# Patient Record
Sex: Female | Born: 1935 | Race: White | Hispanic: No | Marital: Married | Smoking: Never smoker
Health system: Southern US, Community
[De-identification: ages and names within clinical notes are randomized; demographics above are authoritative.]

## PROBLEM LIST (undated history)

## (undated) DIAGNOSIS — I2510 Atherosclerotic heart disease of native coronary artery without angina pectoris: Secondary | ICD-10-CM

## (undated) DIAGNOSIS — I252 Old myocardial infarction: Secondary | ICD-10-CM

## (undated) DIAGNOSIS — R531 Weakness: Secondary | ICD-10-CM

## (undated) DIAGNOSIS — R5383 Other fatigue: Secondary | ICD-10-CM

## (undated) DIAGNOSIS — E7800 Pure hypercholesterolemia, unspecified: Secondary | ICD-10-CM

## (undated) DIAGNOSIS — I10 Essential (primary) hypertension: Secondary | ICD-10-CM

## (undated) DIAGNOSIS — D649 Anemia, unspecified: Secondary | ICD-10-CM

## (undated) DIAGNOSIS — Z8619 Personal history of other infectious and parasitic diseases: Secondary | ICD-10-CM

## (undated) DIAGNOSIS — E559 Vitamin D deficiency, unspecified: Secondary | ICD-10-CM

## (undated) HISTORY — DX: Pure hypercholesterolemia, unspecified: E78.00

## (undated) HISTORY — DX: Personal history of other infectious and parasitic diseases: Z86.19

## (undated) HISTORY — DX: Old myocardial infarction: I25.2

## (undated) HISTORY — PX: BREAST EXCISIONAL BIOPSY: SUR124

## (undated) HISTORY — DX: Other fatigue: R53.83

## (undated) HISTORY — PX: CHOLECYSTECTOMY: SHX55

## (undated) HISTORY — DX: Atherosclerotic heart disease of native coronary artery without angina pectoris: I25.10

## (undated) HISTORY — PX: ABDOMINAL HYSTERECTOMY: SHX81

## (undated) HISTORY — PX: BREAST BIOPSY: SHX20

## (undated) HISTORY — DX: Essential (primary) hypertension: I10

## (undated) HISTORY — DX: Weakness: R53.1

---

## 1997-08-03 ENCOUNTER — Other Ambulatory Visit: Admission: RE | Admit: 1997-08-03 | Discharge: 1997-08-03 | Payer: Self-pay | Admitting: Unknown Physician Specialty

## 1998-02-10 ENCOUNTER — Ambulatory Visit (HOSPITAL_COMMUNITY): Admission: RE | Admit: 1998-02-10 | Discharge: 1998-02-10 | Payer: Self-pay | Admitting: Internal Medicine

## 1998-02-10 ENCOUNTER — Encounter: Payer: Self-pay | Admitting: Internal Medicine

## 1998-08-05 ENCOUNTER — Other Ambulatory Visit: Admission: RE | Admit: 1998-08-05 | Discharge: 1998-08-05 | Payer: Self-pay | Admitting: Obstetrics & Gynecology

## 1999-02-14 ENCOUNTER — Encounter: Payer: Self-pay | Admitting: Internal Medicine

## 1999-02-14 ENCOUNTER — Ambulatory Visit (HOSPITAL_COMMUNITY): Admission: RE | Admit: 1999-02-14 | Discharge: 1999-02-14 | Payer: Self-pay | Admitting: Internal Medicine

## 1999-04-25 ENCOUNTER — Encounter: Payer: Self-pay | Admitting: Internal Medicine

## 1999-04-25 ENCOUNTER — Inpatient Hospital Stay (HOSPITAL_COMMUNITY): Admission: EM | Admit: 1999-04-25 | Discharge: 1999-04-29 | Payer: Self-pay | Admitting: Emergency Medicine

## 1999-04-25 DIAGNOSIS — I252 Old myocardial infarction: Secondary | ICD-10-CM

## 1999-04-25 HISTORY — DX: Old myocardial infarction: I25.2

## 1999-05-24 ENCOUNTER — Encounter (HOSPITAL_COMMUNITY): Admission: RE | Admit: 1999-05-24 | Discharge: 1999-07-03 | Payer: Self-pay | Admitting: Cardiology

## 1999-07-04 ENCOUNTER — Encounter (HOSPITAL_COMMUNITY): Admission: RE | Admit: 1999-07-04 | Discharge: 1999-08-05 | Payer: Self-pay | Admitting: Cardiology

## 1999-08-24 ENCOUNTER — Other Ambulatory Visit: Admission: RE | Admit: 1999-08-24 | Discharge: 1999-08-24 | Payer: Self-pay | Admitting: Obstetrics and Gynecology

## 1999-11-09 ENCOUNTER — Ambulatory Visit (HOSPITAL_COMMUNITY): Admission: RE | Admit: 1999-11-09 | Discharge: 1999-11-10 | Payer: Self-pay | Admitting: Cardiology

## 2000-02-15 ENCOUNTER — Encounter: Payer: Self-pay | Admitting: Internal Medicine

## 2000-02-15 ENCOUNTER — Ambulatory Visit (HOSPITAL_COMMUNITY): Admission: RE | Admit: 2000-02-15 | Discharge: 2000-02-15 | Payer: Self-pay | Admitting: Internal Medicine

## 2000-10-02 ENCOUNTER — Other Ambulatory Visit: Admission: RE | Admit: 2000-10-02 | Discharge: 2000-10-02 | Payer: Self-pay | Admitting: Obstetrics and Gynecology

## 2001-02-18 ENCOUNTER — Encounter: Payer: Self-pay | Admitting: Internal Medicine

## 2001-02-18 ENCOUNTER — Ambulatory Visit (HOSPITAL_COMMUNITY): Admission: RE | Admit: 2001-02-18 | Discharge: 2001-02-18 | Payer: Self-pay | Admitting: Internal Medicine

## 2001-11-07 ENCOUNTER — Other Ambulatory Visit: Admission: RE | Admit: 2001-11-07 | Discharge: 2001-11-07 | Payer: Self-pay | Admitting: Obstetrics and Gynecology

## 2002-02-19 ENCOUNTER — Encounter: Payer: Self-pay | Admitting: Obstetrics and Gynecology

## 2002-02-19 ENCOUNTER — Ambulatory Visit (HOSPITAL_COMMUNITY): Admission: RE | Admit: 2002-02-19 | Discharge: 2002-02-19 | Payer: Self-pay | Admitting: Obstetrics and Gynecology

## 2003-01-05 ENCOUNTER — Other Ambulatory Visit: Admission: RE | Admit: 2003-01-05 | Discharge: 2003-01-05 | Payer: Self-pay | Admitting: Dermatology

## 2003-02-25 ENCOUNTER — Ambulatory Visit (HOSPITAL_COMMUNITY): Admission: RE | Admit: 2003-02-25 | Discharge: 2003-02-25 | Payer: Self-pay | Admitting: Internal Medicine

## 2003-04-01 ENCOUNTER — Other Ambulatory Visit: Admission: RE | Admit: 2003-04-01 | Discharge: 2003-04-01 | Payer: Self-pay | Admitting: Dermatology

## 2004-03-30 ENCOUNTER — Ambulatory Visit (HOSPITAL_COMMUNITY): Admission: RE | Admit: 2004-03-30 | Discharge: 2004-03-30 | Payer: Self-pay | Admitting: Internal Medicine

## 2005-03-31 ENCOUNTER — Ambulatory Visit (HOSPITAL_COMMUNITY): Admission: RE | Admit: 2005-03-31 | Discharge: 2005-03-31 | Payer: Self-pay | Admitting: Internal Medicine

## 2005-04-19 ENCOUNTER — Encounter: Admission: RE | Admit: 2005-04-19 | Discharge: 2005-04-19 | Payer: Self-pay | Admitting: Internal Medicine

## 2005-12-06 ENCOUNTER — Inpatient Hospital Stay (HOSPITAL_BASED_OUTPATIENT_CLINIC_OR_DEPARTMENT_OTHER): Admission: RE | Admit: 2005-12-06 | Discharge: 2005-12-06 | Payer: Self-pay | Admitting: Cardiology

## 2006-04-25 ENCOUNTER — Ambulatory Visit (HOSPITAL_COMMUNITY): Admission: RE | Admit: 2006-04-25 | Discharge: 2006-04-25 | Payer: Self-pay | Admitting: Internal Medicine

## 2007-05-09 ENCOUNTER — Ambulatory Visit (HOSPITAL_COMMUNITY): Admission: RE | Admit: 2007-05-09 | Discharge: 2007-05-09 | Payer: Self-pay | Admitting: Internal Medicine

## 2008-03-05 ENCOUNTER — Emergency Department (HOSPITAL_BASED_OUTPATIENT_CLINIC_OR_DEPARTMENT_OTHER): Admission: EM | Admit: 2008-03-05 | Discharge: 2008-03-05 | Payer: Self-pay | Admitting: Emergency Medicine

## 2008-03-05 ENCOUNTER — Ambulatory Visit: Payer: Self-pay | Admitting: Diagnostic Radiology

## 2008-03-13 ENCOUNTER — Ambulatory Visit: Payer: Self-pay | Admitting: Interventional Radiology

## 2008-03-13 ENCOUNTER — Emergency Department (HOSPITAL_BASED_OUTPATIENT_CLINIC_OR_DEPARTMENT_OTHER): Admission: EM | Admit: 2008-03-13 | Discharge: 2008-03-13 | Payer: Self-pay | Admitting: Emergency Medicine

## 2008-05-21 ENCOUNTER — Ambulatory Visit (HOSPITAL_COMMUNITY): Admission: RE | Admit: 2008-05-21 | Discharge: 2008-05-21 | Payer: Self-pay | Admitting: Internal Medicine

## 2009-10-03 IMAGING — CT CT CERVICAL SPINE W/O CM
3 series · 16 of 33 positions shown, 19 images · non-contrast
Comparison: 03/13/2008

CLINICAL DATA: 75-year-old female fall, recent elbow fracture, neck
pain

CT CERVICAL SPINE WITHOUT CONTRAST
TECHNIQUE: Multidetector CT imaging of the cervical spine was
performed. Multiplanar CT image reconstructions were also
generated.

[Series 3: c_spine 2.0 b41s st · axial · 0.26mm/px · z∈[-278,-160]mm · 8 of 71 slices shown, 10 images]
[im 6/71  soft-tissue]
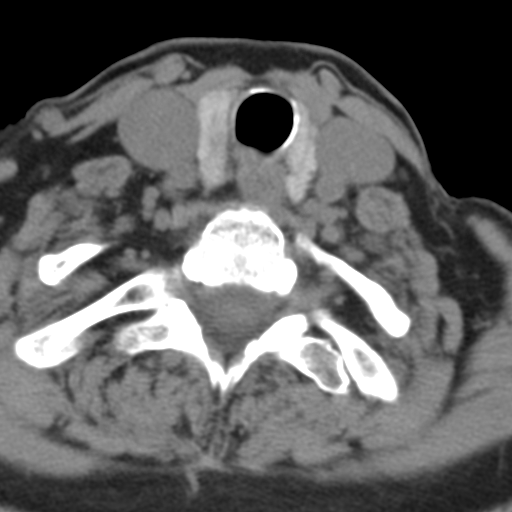
[im 6/71  bone]
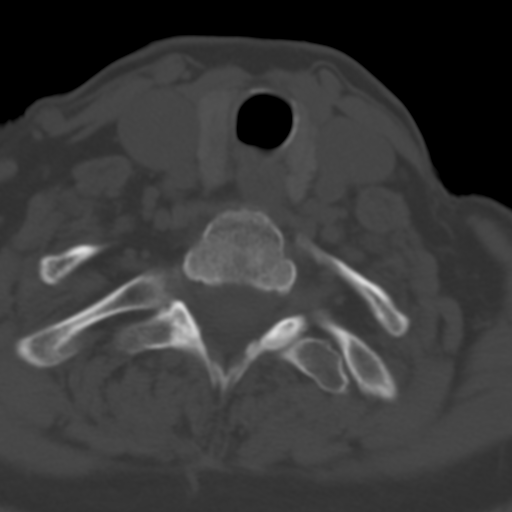
[im 17/71  bone]
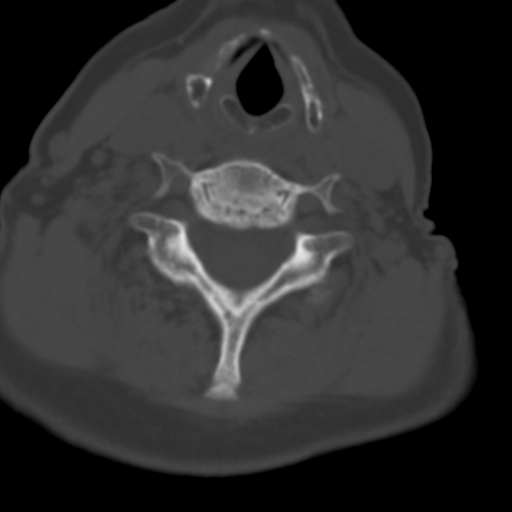
[im 22/71  bone]
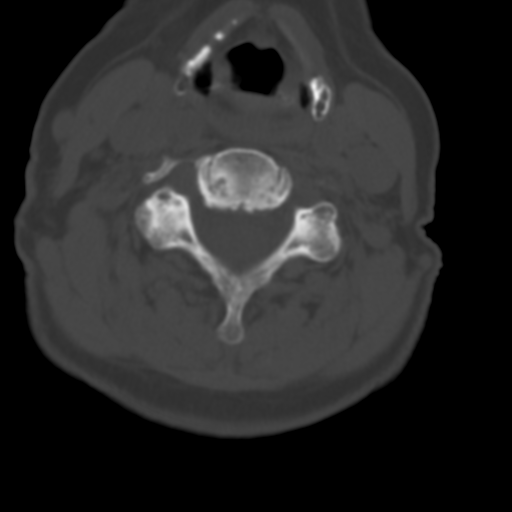
[im 33/71  bone]
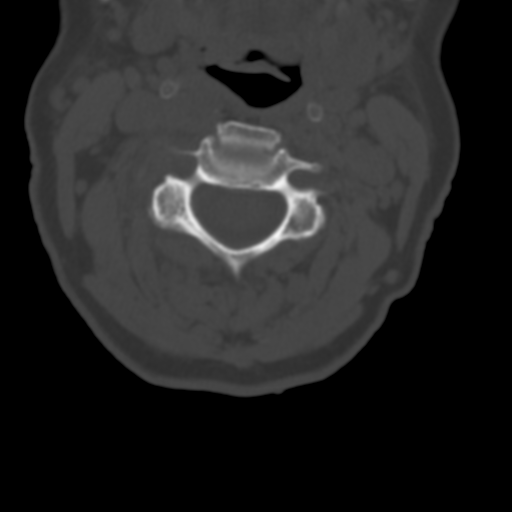
[im 38/71  soft-tissue]
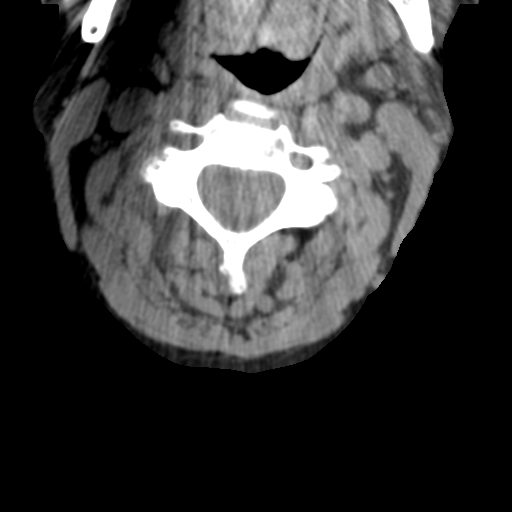
[im 38/71  bone]
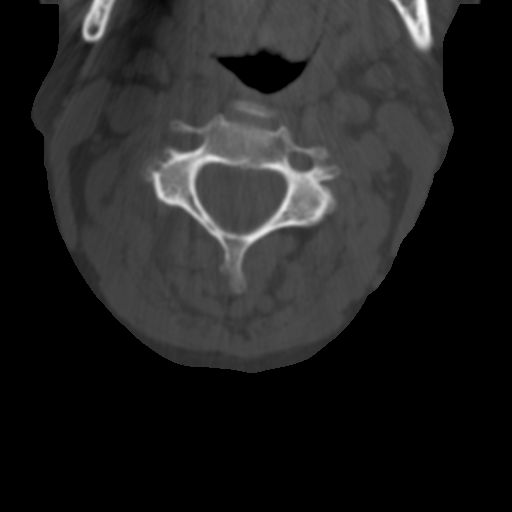
[im 49/71  bone]
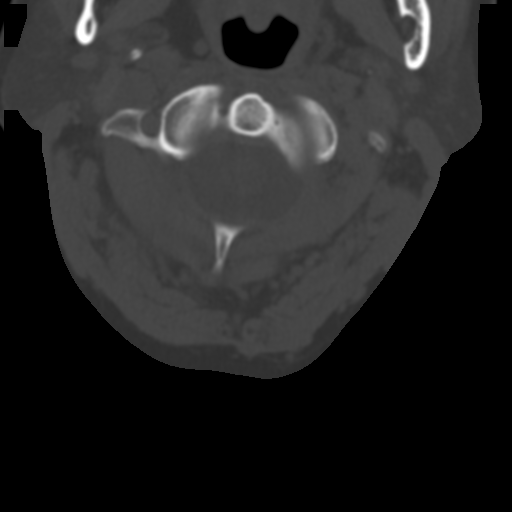
[im 54/71  bone]
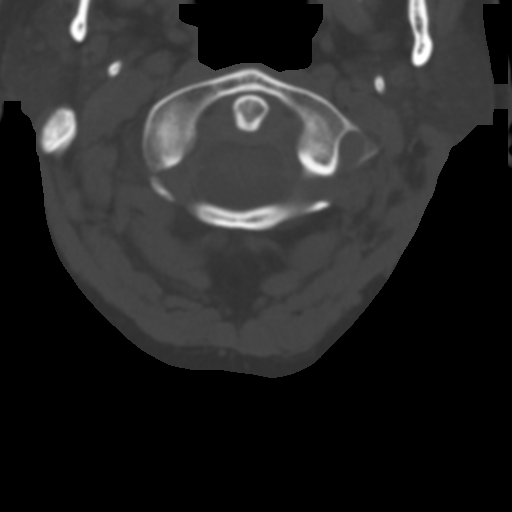
[im 65/71  bone]
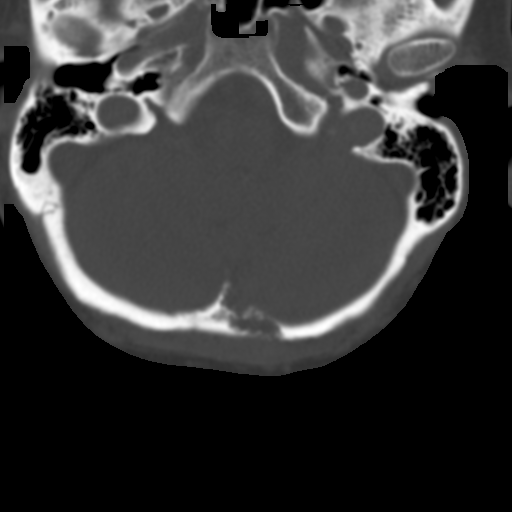

[Series 6: c_spine 2.0 coronal · coronal · 0.27mm/px · 3 of 52 slices shown]
[im 11/52  bone]
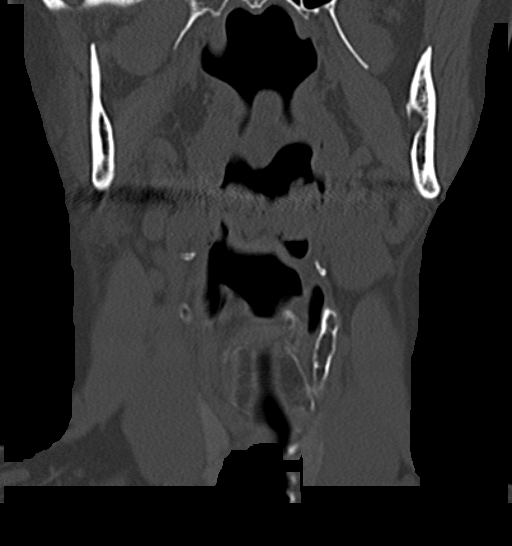
[im 21/52  bone]
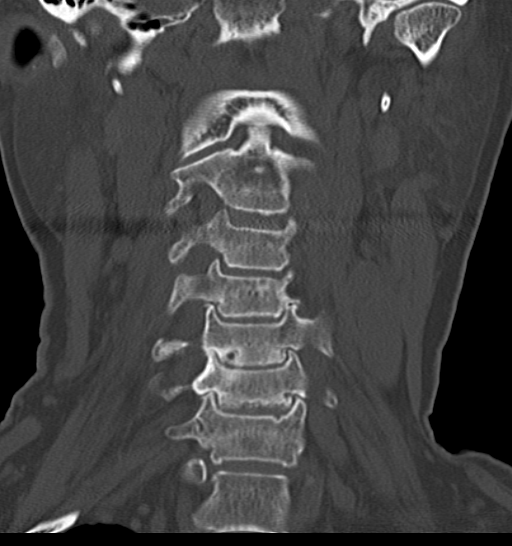
[im 31/52  bone]
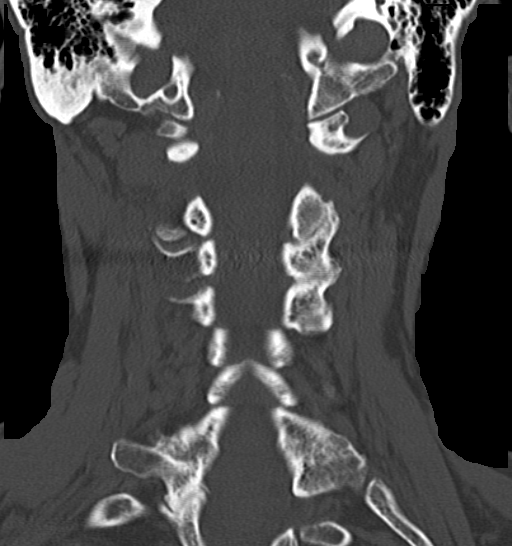

[Series 7: c_spine 2.0 sagittal · sagittal · 0.26mm/px · 5 of 56 slices shown, 6 images]
[im 19/56  bone]
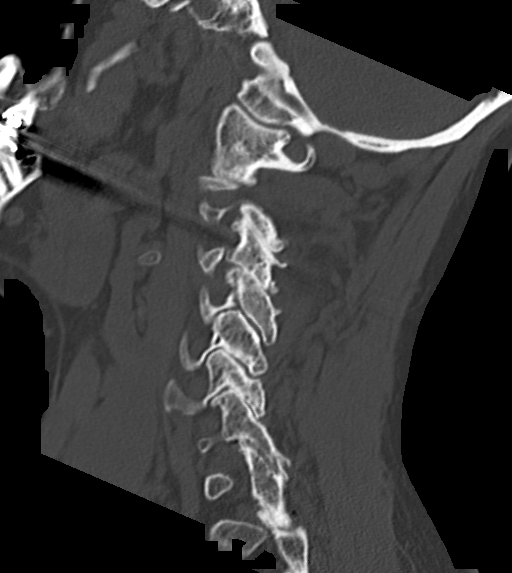
[im 23/56  bone]
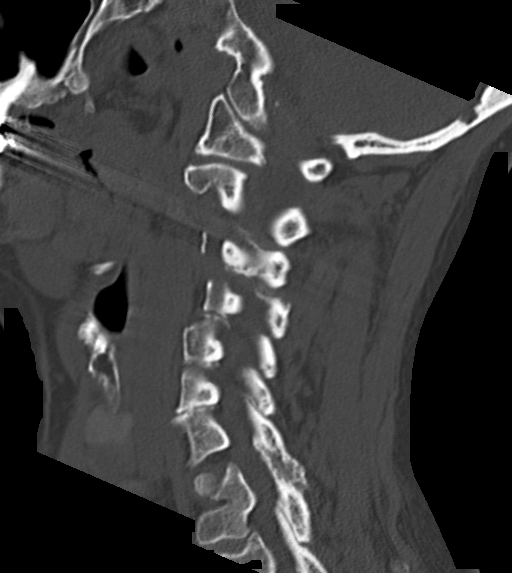
[im 28/56  soft-tissue]
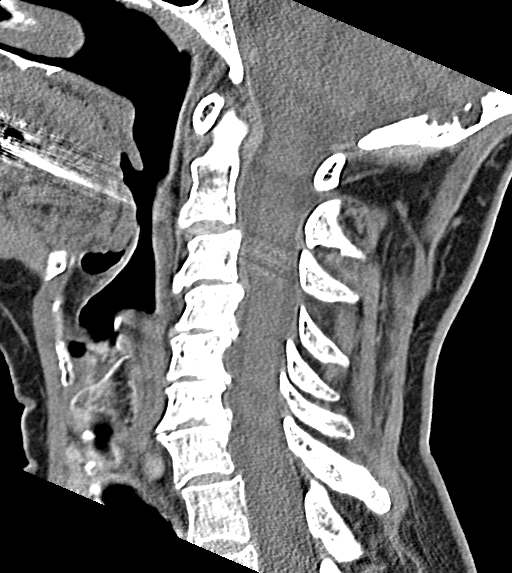
[im 28/56  bone]
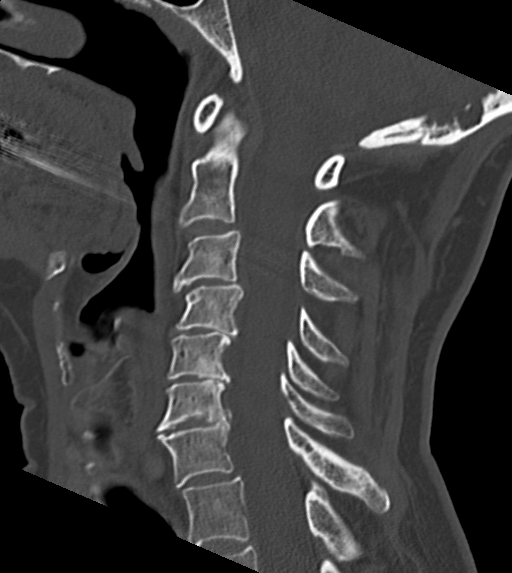
[im 33/56  bone]
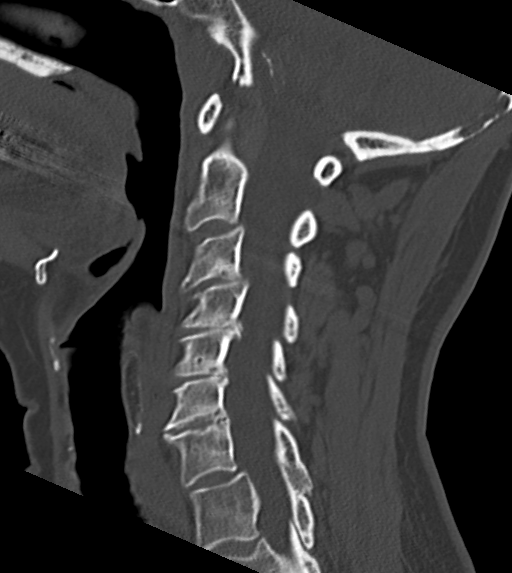
[im 37/56  bone]
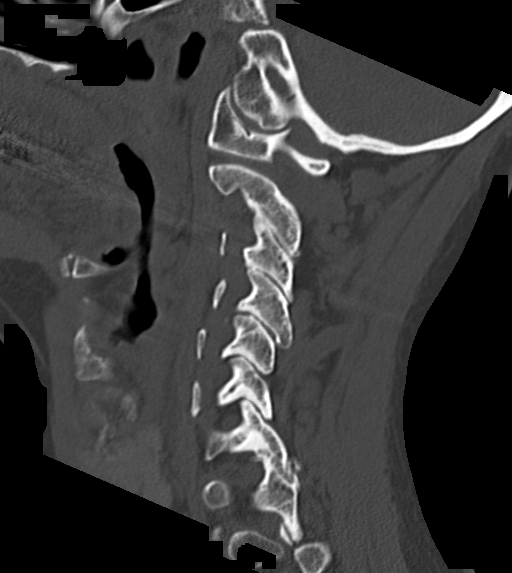

[16 of 33 positions shown; findings below may reference images not displayed]

FINDINGS: Alignment is near anatomic.  There is very slight
anterior listhesis of C3 on C4 and retrolisthesis of C4 on C5
measuring 2 mm related to spondylosis and degenerative disc disease
at these levels.  Diffuse cervical degenerative disc disease, facet
arthropathy and uncovertebral joint disease is noted from the C3-
C7.  Facets are aligned.  Foramina remain patent.  No focal
kyphosis, wedge shaped compression or acute fracture.  Prevertebral
soft tissues are intact.  No visualized epidural hematoma
IMPRESSION: Diffuse cervical degenerative changes as described.  No acute
fracture or static injury by noncontrast CT.

## 2009-10-04 ENCOUNTER — Ambulatory Visit (HOSPITAL_COMMUNITY): Admission: RE | Admit: 2009-10-04 | Discharge: 2009-10-04 | Payer: Self-pay | Admitting: Internal Medicine

## 2010-03-08 ENCOUNTER — Ambulatory Visit: Payer: Self-pay | Admitting: Cardiology

## 2010-03-08 ENCOUNTER — Encounter
Admission: RE | Admit: 2010-03-08 | Discharge: 2010-03-08 | Payer: Self-pay | Source: Home / Self Care | Attending: Cardiology | Admitting: Cardiology

## 2010-03-22 ENCOUNTER — Telehealth (INDEPENDENT_AMBULATORY_CARE_PROVIDER_SITE_OTHER): Payer: Self-pay | Admitting: *Deleted

## 2010-03-23 ENCOUNTER — Ambulatory Visit: Admission: RE | Admit: 2010-03-23 | Discharge: 2010-03-23 | Payer: Self-pay | Source: Home / Self Care

## 2010-03-23 ENCOUNTER — Encounter (HOSPITAL_COMMUNITY)
Admission: RE | Admit: 2010-03-23 | Discharge: 2010-04-19 | Payer: Self-pay | Source: Home / Self Care | Attending: Cardiology | Admitting: Cardiology

## 2010-03-23 ENCOUNTER — Encounter: Payer: Self-pay | Admitting: *Deleted

## 2010-03-23 ENCOUNTER — Encounter: Payer: Self-pay | Admitting: Cardiology

## 2010-04-21 NOTE — Assessment & Plan Note (Signed)
Summary: Cardiology Nuclear Testing  Nuclear Med Background Indications for Stress Test: Evaluation for Ischemia, Stent Patency, PTCA Patency   History: Angioplasty, Heart Catheterization, Myocardial Infarction, Myocardial Perfusion Study, Stents  History Comments: '01 MI>PTCA/Stent-DX; '07 Cath:diffuse 3-vessel CAD, w/o focal stenosis, EF=40-45%; '09 [id number] scar, no ischemia, EF=47%    Symptoms: DOE, Fatigue    Nuclear Pre-Procedure Cardiac Risk Factors: Hypertension, Lipids Caffeine/Decaff Intake: None NPO After: 6:00 PM Lungs: Clear.  O2 Sat 99% on RA. IV 0.9% NS with Angio Cath: 22g     IV Site: R Antecubital IV Started by: Irean Hong, RN Chest Size (in) 36     Cup Size C     Height (in): 64 Weight (lb): 135 BMI: 23.26 Tech Comments: Held metoprolol this am. No medications taken today.  Nuclear Med Study 1 or 2 day study:  1 day     Stress Test Type:  Treadmill/Lexiscan Reading MD:  Cassell Clement, MD     Referring MD:  Roger Shelter, MD Resting Radionuclide:  Technetium 61m Tetrofosmin     Resting Radionuclide Dose:  11.0 mCi  Stress Radionuclide:  Technetium 41m Tetrofosmin     Stress Radionuclide Dose:  33.0 mCi   Stress Protocol Exercise Time (min):  2:00 min     Max HR:  116 bpm     Predicted Max HR:  145 bpm  Max Systolic BP: 150 mm Hg     Percent Max HR:  80 %Rate Pressure Product:  29528  Lexiscan: 0.4 mg   Stress Test Technologist:  Rea College, CMA-N     Nuclear Technologist:  Domenic Polite, CNMT  Rest Procedure  Myocardial perfusion imaging was performed at rest 45 minutes following the intravenous administration of Technetium 69m Tetrofosmin.  Stress Procedure  The patient received IV Lexiscan 0.4 mg over 15-seconds with concurrent low level exercise and then Technetium 28m Tetrofosmin was injected at 30-seconds.  There were nonspecific ST-T wave changes and occasional PVC's with infusion.  Quantitative spect images were obtained after a 45  minute delay.  QPS Raw Data Images:  Normal; no motion artifact; normal heart/lung ratio. Stress Images:  There is decreased uptake in the anterior wall and apex. Rest Images:  There is decreased uptake in the anterior wall and apex. Subtraction (SDS):  There is a fixed defect that is most consistent with a previous infarction. Transient Ischemic Dilatation:  0.98  (Normal <1.22)  Lung/Heart Ratio:  .32  (Normal <0.45)  Quantitative Gated Spect Images QGS EDV:  83 ml QGS ESV:  42 ml QGS EF:  49 % QGS cine images:  Anteroapical hypokinesis.  Findings Low risk nuclear study  Evidence for anterior (septal apical) infarct  Evidence for LV Dysfunction LV Dysfunction    Overall Impression  Exercise Capacity: Lexiscan with  exercise. BP Response: Normal blood pressure response. Clinical Symptoms: No chest pain ECG Impression: Mild ST depression in inferolateral leads with stress Overall Impression: Low risk stress nuclear study. Overall Impression Comments: Large fixed scar involving anterior wall and apex.  Appended Document: Cardiology Nuclear Testing copy sent to Dr.Tennant

## 2010-04-21 NOTE — Progress Notes (Signed)
Summary: Nuclear pre procedure  Phone Note Outgoing Call Call back at [facility] Phone [phone]   Call placed by: Rea College, CMA,  March 22, 2010 2:58 PM Call placed to: Patient Summary of Call: Reviewed information on My[employer] Information Sheet (see scanned document for further details).  Arline Asp spoke with husband.      Nuclear Med Background Indications for Stress Test: Evaluation for Ischemia, Stent Patency, PTCA Patency   History: Angioplasty, Heart Catheterization, Myocardial Infarction, Myocardial Perfusion Study, Stents  History Comments: '01 MP>PTCA/Stent-DX; '07 Cath:diffuse 3-vessel CAD, w/o focal stenosis, EF=40-45%; '09 [id number] scar, no ischemia, EF=47%    Symptoms: DOE    Nuclear Pre-Procedure Cardiac Risk Factors: Hypertension, Lipids

## 2010-06-11 ENCOUNTER — Emergency Department (HOSPITAL_BASED_OUTPATIENT_CLINIC_OR_DEPARTMENT_OTHER)
Admission: EM | Admit: 2010-06-11 | Discharge: 2010-06-11 | Disposition: A | Discharge status: Home / Self Care | Payer: Medicare Other | Attending: Emergency Medicine | Admitting: Emergency Medicine

## 2010-06-11 DIAGNOSIS — N289 Disorder of kidney and ureter, unspecified: Secondary | ICD-10-CM | POA: Insufficient documentation

## 2010-06-11 DIAGNOSIS — I10 Essential (primary) hypertension: Secondary | ICD-10-CM | POA: Insufficient documentation

## 2010-06-11 DIAGNOSIS — Z79899 Other long term (current) drug therapy: Secondary | ICD-10-CM | POA: Insufficient documentation

## 2010-06-11 DIAGNOSIS — I252 Old myocardial infarction: Secondary | ICD-10-CM | POA: Insufficient documentation

## 2010-06-11 DIAGNOSIS — R5381 Other malaise: Secondary | ICD-10-CM | POA: Insufficient documentation

## 2010-06-11 DIAGNOSIS — N12 Tubulo-interstitial nephritis, not specified as acute or chronic: Secondary | ICD-10-CM | POA: Insufficient documentation

## 2010-06-11 DIAGNOSIS — I2510 Atherosclerotic heart disease of native coronary artery without angina pectoris: Secondary | ICD-10-CM | POA: Insufficient documentation

## 2010-06-11 DIAGNOSIS — M810 Age-related osteoporosis without current pathological fracture: Secondary | ICD-10-CM | POA: Insufficient documentation

## 2010-06-11 LAB — URINE MICROSCOPIC-ADD ON

## 2010-06-11 LAB — DIFFERENTIAL
Basophils Absolute: 0 10*3/uL (ref 0.0–0.1)
Basophils Relative: 0 % (ref 0–1)
Eosinophils Absolute: 0 10*3/uL (ref 0.0–0.7)
Eosinophils Relative: 0 % (ref 0–5)
Lymphocytes Relative: 13 % (ref 12–46)
Lymphs Abs: 1.2 10*3/uL (ref 0.7–4.0)
Monocytes Absolute: 1.6 10*3/uL — ABNORMAL HIGH (ref 0.1–1.0)
Monocytes Relative: 17 % — ABNORMAL HIGH (ref 3–12)
Neutro Abs: 6.4 10*3/uL (ref 1.7–7.7)
Neutrophils Relative %: 70 % (ref 43–77)

## 2010-06-11 LAB — URINALYSIS, ROUTINE W REFLEX MICROSCOPIC
Bilirubin Urine: NEGATIVE
Glucose, UA: NEGATIVE mg/dL
Ketones, ur: NEGATIVE mg/dL
Nitrite: NEGATIVE
Protein, ur: NEGATIVE mg/dL
Specific Gravity, Urine: 1.017 (ref 1.005–1.030)
Urobilinogen, UA: 1 mg/dL (ref 0.0–1.0)
pH: 5.5 (ref 5.0–8.0)

## 2010-06-11 LAB — CBC
HCT: 37.4 % (ref 36.0–46.0)
Hemoglobin: 13.1 g/dL (ref 12.0–15.0)
MCH: 30.8 pg (ref 26.0–34.0)
MCHC: 35 g/dL (ref 30.0–36.0)
MCV: 87.8 fL (ref 78.0–100.0)
Platelets: 267 10*3/uL (ref 150–400)
RBC: 4.26 MIL/uL (ref 3.87–5.11)
RDW: 13 % (ref 11.5–15.5)
WBC: 9.2 10*3/uL (ref 4.0–10.5)

## 2010-06-11 LAB — BASIC METABOLIC PANEL
BUN: 19 mg/dL (ref 6–23)
CO2: 20 mEq/L (ref 19–32)
Calcium: 9.3 mg/dL (ref 8.4–10.5)
Chloride: 100 mEq/L (ref 96–112)
Creatinine, Ser: 1.5 mg/dL — ABNORMAL HIGH (ref 0.4–1.2)
GFR calc Af Amer: 41 mL/min — ABNORMAL LOW (ref >60)
GFR calc non Af Amer: 34 mL/min — ABNORMAL LOW (ref >60)
Glucose, Bld: 111 mg/dL — ABNORMAL HIGH (ref 70–99)
Potassium: 3.9 mEq/L (ref 3.5–5.1)
Sodium: 136 mEq/L (ref 135–145)

## 2010-06-13 LAB — URINE CULTURE
Colony Count: >=100000
Culture  Setup Time: 201203242151

## 2010-07-18 ENCOUNTER — Other Ambulatory Visit: Payer: Self-pay | Admitting: *Deleted

## 2010-07-18 DIAGNOSIS — I10 Essential (primary) hypertension: Secondary | ICD-10-CM

## 2010-07-18 MED ORDER — METOPROLOL SUCCINATE ER 50 MG PO TB24: 50.0000 mg | ORAL_TABLET | Freq: Every day | ORAL | Status: DC

## 2010-08-05 NOTE — Discharge Summary (Signed)
[place]. [facility]  Patient:    [name], [name]                      MRN: [id number] Adm. Date:  [id number] Disc. Date: [id number] Attending:  Eleanora Neighbor Dictator:   Jennet Maduro. Earl Gala, R.N., A.N.P. CC:         [place]inn [place]. Earl Gala, M.D.   Discharge Summary  DISCHARGE DIAGNOSIS:   Recurrent chest discomfort in the setting of an abnormal stress Cardiolite study with subsequent cardiac catheterization and subsequent revascularization due to in-stent restenosis to the first diagonal.  SECONDARY DIAGNOSES: 1. Atherosclerotic cardiovascular disease with previous history of anterior    wall myocardial infarction in February 2001 with subsequent stent    deployment to the first diagonal.  There was residual disease of    approximately 30 to 40% in the left anterior descending artery, circumflex,    as well as right coronary artery. 2. Hypercholesterolemia, currently on Zocor. 3. Hypertension, currently controlled.  HISTORY OF PRESENT ILLNESS:  [name] is a very pleasant 77 year old female who has known atherosclerotic cardiovascular disease.  She has had previous heart attack earlier this year which was treated with revascularization.  On her six-month followup visit, she was referred on for a stress Cardiolite study.  This demonstrated basically good exercise tolerance yet clinically with chest discomfort as well as electrographically positive for ischemia. Cineangiogram was positive for redistribution, and she was subsequently referred on for repeat cardiac catheterization.  Please see the dictated History and Physical for further patient presentation and profile.  LABORATORY DATA ON ADMISSION:  Chemistries were all satisfactory with sodium 139, potassium 3.9, chloride 101, CO2 25, BUN 15, creatinine 1.0, glucose 90. PT and PTT were unremarkable.  CBC on admission showed hemoglobin of 12.8, hematocrit 40, white count 8.3, platelets 316.  HOSPITAL  COURSE:  The patient was admitted from short stay in order to undergo coronary angiography per Dr. Roger Shelter.  The overall procedure was tolerated well without any known complications.  The ejection fraction was estimated to be 40 to 45%.  There was anterolateral hypokinesis.  There are irregularities in the LAD.  The first diagonal had a 90% in-stent restenosis, and subsequently angioplasty with a cutting balloon was performed with overall satisfactory results obtained.  There was diffuse disease distal to the stent, however.  The left circumflex had some irregularities as well as the right coronary artery.  Postprocedure, she did receive IV Integrilin x 12 hours.  Today, on November 10, 1999, she is doing well.  She has had no recurrent chest discomfort.  She has been up and ambulatory without problems.  The groin has remained stable. Overall physical exam was unremarkable.  Her a.m. CBC this morning does show a slight drop in hemoglobin and hematocrit down to 11.7 with a hematocrit of 34, and this will be need to be repeated at her followup visit.  Otherwise, she is felt to be stable for discharge today.  DISCHARGE CONDITION:  Stable.  DISCHARGE MEDICATIONS:  She will resume all of her previous home medicines which include: 2. Calcium tablets daily. 2. Toprol XL 50 mg daily. 3. Imdur 30 mg daily. 4. Altace 2.5 mg daily 5. Aspirin daily. 6. Zocor 20 mg daily. 7. Premarin daily. 8. We will be adding Plavix 75 mg daily for the next two weeks only, to be    taken along with her aspirin.  ACTIVITY:  Light for the next few days,  then she may resume her normal ADLs.  DIET:  Low fat, low cholesterol.  WOUND CARE:  She is to place an ice pack to the groin as needed.  FOLLOWUP:  She is to call for any problems.  Otherwise, we will plan on seeing her back in the office in about 10 days, and she is asked to call to make that appointment. DD:  11/10/99 TD:  11/10/99 Job:  [id number]

## 2010-08-05 NOTE — Discharge Summary (Signed)
. [facility]  Patient:    [name], [name]                      MRN: [id number] Adm. Date:  [id number] Disc. Date: [id number] Attending:  Darnelle Bos Dictator:   Jennet Maduro Earl Gala, R.N., A.N.P. CC:         Colleen Can. Deborah Chalk, M.D.             Winn Jock. Earl Gala, M.D.                           Discharge Summary  DISCHARGE DIAGNOSES: 1. Status post acute anterior myocardial infarction with subsequent emergent    cardiac catheterization and angioplasty as well as stent deployment to the first    diagonal. 2. Hypercholesterolemia, currently initiated on statin therapy. 3. Urinary tract infection, most likely nosocomial-acquired, currently on Levaquin. 4. Hypertension, currently stable.  HISTORY OF PRESENT ILLNESS:  Mrs. Tokarski is a very pleasant 77 year old female.  She presents to [facility] emergently on April 25, 1999, with complaints of chest pain.  She had previously been taking antacids without relief.  It was  substernal in nature and was nonradiating.  However, she did have some numbness and tingling down the arms.  It was described as a pressure-like sensation.  Upon her arrival here, her EKG showed marked ST elevation in V1, V3, lead I, and aVL with reciprocal changes compatible with anterior wall myocardial infarction.  She was subsequently taken emergently to the cardiac catheterization laboratory.  Please see the dictated history and physical per Dr. Roger Shelter for further patient presentation and profile.  ADMISSION LABORATORY DATA:  White count was 9.1, hemoglobin 13.7, hematocrit 38, platelets 288.  PT and PTT were unremarkable.  Chemistries revealed sodium 139,  potassium was 2.3, chloride was 100, CO2 was 25, BUN was 9, creatinine was 0.9, and glucose was 145.  EKG is as described above.  Chest x-ray showed no active disease.  HOSPITAL COURSE:  Mrs. Blumenthal was taken emergently on to the  cardiac catheterization laboratory where she underwent coronary angiography per Dr. Roger Shelter.  The overall procedure was tolerated well without any known complications. The left ventriculogram demonstrated anterolateral akinesis with ejection fraction 55-60%, left main coronary artery was normal.  The left anterior descending had an early diagonal branch with 100% occlusion approximately 3-5 mm from the ostium.  The LAD has 20-30% irregularities.  The left circumflex has proximal 30-40% narrowing.  The right coronary artery is a large dominant vessel with approximately 30-40% narrowings as well.  Subsequent angioplasty and stent were performed to he first diagonal with a 13 mm tetra stent placed on a maximum of 14 atm.  There was some distal narrowing of about 40-50% after the stent, but it was elected at that time to limit the stenting to the affected area.  The patient did receive IV Aggrastat and was subsequently transferred to the intensive care unit for further monitoring and evaluation.  By April 26, 1999, she was feeling well.  She had no recurrent chest pain. Vital signs remained stable.  Groin was unremarkable.  Her potassium was replaced accordingly and was felt to be most likely due to diuretic therapy.  Her peak CK-MB was 264.1.  At that time, her IV nitroglycerin was changed over to paste.  She as begun to mobilize with cardiac rehabilitation.  She was subsequently transferred to 2000, where  she has continued to do well throughout the remainder of her hospitalization.  A lipid panel was drawn which showed an LDL cholesterol of 119 with a total cholesterol of 202, HDL 51, triglycerides 162.  Low-dose statin therapy was subsequently initiated.  By April 29, 1999, she continues to feel well.  She has been up and ambulatory with cardiac rehabilitation without complaints.  We currently have a 2-D echocardiogram pending, and plans are now made for probable  discharge later on his evening if 2-D echocardiogram remains stable.  It is to be noted that she does ave a UTI based on urinalysis, with culture in progress, and will need to go ahead nd initiate antibiotic therapy.  CONDITION ON DISCHARGE:  Stable.  DISCHARGE MEDICATIONS: 1. Nitroglycerin 1/150 sublingual p.r.n. 2. Toprol XL 50 mg a day. 3. Altace 2.5 daily. 4. Imdur 30 mg a day. 5. Aspirin a day. 6. Plavix 75 mg a day for three weeks. 7. Zocor 20 mg a day. 8. Levaquin 250 mg for the next seven days. 9. She may continue her Premarin as she was taking at home.  ACTIVITY:  Light.  No driving.  She may walk 10-15 minutes one to two times a day.  DIET:  Low fat, low cholesterol.  WOUND CARE:  She is to call for any trouble with the right groin such as signs f infection or swelling, etc.  DISCHARGE INSTRUCTIONS:  Otherwise, she is to call our office for any other problems to include recurrent chest pain, shortness of breath, and the office number is made available to her.  FOLLOW-UP:  Will plan on seeing her back in about 10 days.  She is asked to call to make that appointment. DD:  04/29/99 TD:  04/30/99 Job: [id number]

## 2010-08-05 NOTE — Cardiovascular Report (Signed)
[place]. [facility]  Patient:    [name]                       MRN: [id number] Proc. Date: 04/25/99 Adm. Date:  [id number] Attending:  Darnelle Bos CC:         Cardiac Catheterization Laboratory             Winn Jock. Earl Gala, M.D.                        Cardiac Catheterization  PROCEDURE:  Left heart catheterization with selective coronary angiography, left ventricular angiography with angioplasty, with stenting of the first diagonal vessel.  CARDIOLOGIST:  Colleen Can. Deborah Chalk, M.D.  INDICATIONS:  Ms. Agostinelli is a 77 year old female who had a substernal indigestion-like pain at approximately 6 p.m. last night and again at 10 p.m. he had relief after a short period of time and thought it was indigestion.  At 4 a.m. she awoke with severe substernal chest pain.  She came to the emergency room where an electrocardiogram showed changes compatible with an acute anterior myocardial infarction.  She was taken to the cardiac catheterization laboratory for evaluation and intervention.  TYPE AND SITE OF ENTRY:  Percutaneous right femoral artery (7-French sheath).  CONTRAST MATERIAL:  Omnipaque.  MEDICATIONS GIVEN PRIOR TO THE PROCEDURE:  Heparin, IV nitroglycerin, morphine.  MEDICATIONS GIVEN DURING THE PROCEDURE:  Morphine sulfate, Versed 2 mg IV, Aggrastat.  COMMENTS:  The patient tolerated the procedure well.  Her potassium was low and she received potassium during the procedure as well.  HEMODYMAMIC DATA: Aortic pressure:  135/77. LV:  129/18.  There is no aortic valve gradient noted.  ANGIOGRAPHIC DATA: 1. Right coronary artery:  The right coronary artery was a dominant vessel.    It had irregularities of 30%-40% proximally. 2. Left main coronary artery:  Is normal. 3. Left anterior descending coronary artery:  The left anterior descending    coronary artery had a large proximal diagonal vessel.  It was totally    occluded  approximately 5.0 mm from its ostium.  The left anterior descending    artery itself extended to and wrapped around the apex.  There were 30%-40%    narrowings in the proximal left anterior descending. 4. Left circumflex coronary artery:  The left circumflex had 30%-40% proximal    narrowings.  The distal vessel is reasonably large.  LEFT VENTRICULAR ANGIOGRAM:  Was performed after the intervention.  It showed a  rather discrete area of high anterolateral akinesia but with a global ejection fraction was well-preserved, with good apical function.  Global ejection fraction would be estimated to be in the 50%-55% range.  ANGIOPLASTY PROCEDURE:  We exchanged catheters for a 7-French 3.5 guide which was a satisfactory fit.  The Hi-Torque floppy guide wire passed easily across the occluded vessel.  We initially dilated with a 3.0 mm x 20.0 mm CrossSail balloon and were able to get reperfusion, but the patient had persistent ST elevation, s well as persistent chest pain.  After observing for a period of time, we returned with a 3.0 mm x 13.0 mm stent.  The stent was placed in the proximal diagonal and before a side branch that was present.  She did have disease beyond the side branch and further down the diagonal where we elected not to proceed on with stenting f that.  The stent was inflated to 14 atmospheres.  The final result showed satisfactory flow, but with some definite 40%-50% narrowing beyond the stent segment, but the stent itself created a negative contour in the artery.  OVERALL IMPRESSION:  Acute anterolateral myocardial infarction with successful angioplasty and stenting of a diagonal vessel, but with mild to moderate three-vessel coronary artery disease and overall well-preserved global left ventricular function, but with anterolateral hypokinesis. DD:  04/25/99 TD:  04/25/99 Job: [id number]

## 2010-08-05 NOTE — H&P (Signed)
[place]. [facility]  Patient:    [name]                       MRN: [id number] Adm. Date:  [id number] Attending:  Darnelle Bos CC:         Winn [place]ock. Earl Gala, M.D.                         History and Physical  REASON FOR ADMISSION:  Acute anterior myocardial infarction.  HISTORY OF PRESENT ILLNESS:  [name] is a pleasant 77 year old female. Yesterday evening while in church she had some vague anterior chest discomfort he thought was indigestion.  She had recurrent pain about 10 p.m. last night that as short lived.  Then, approximately at 4 a.m. she had the onset of severe mid substernal chest pain that was nonradiating, it was a pressure-like sensation. It was noted to be quite severe.  She did not have nausea, vomiting, or diaphoresis, but did have a bowel movement early this morning.  She took antacids but without relief and took an aspirin at home and presented to the emergency room.  PAST MEDICAL HISTORY:  She has a history of hypertension.  She has had gallbladder surgery and she has had breast biopsies as well as a hysterectomy.  CURRENT MEDICATIONS:  Hydrochlorothiazide and Premarin.  SOCIAL HISTORY:  She is a nonsmoker.  No alcohol.  She is married.  FAMILY HISTORY:  Negative for heart disease.  REVIEW OF SYSTEMS:  Amazingly negative.  She is active, does for herself.  She as had a history of mild elevation of her cholesterol levels.  PHYSICAL EXAMINATION:  GENERAL:  She is a pleasant 77 year old female.  VITAL SIGNS:  Her heart rate is 82.  Her blood pressure is 161/77.  HEENT:  Negative.  NECK:  Supple without bruits.  No carotid bruits.  LUNGS:  Clear.  HEART:  Shows regular rate with no S3.  BREASTS:  Not examined.  ABDOMEN:  Soft and nontender.  EXTREMITIES:  Without edema but with good peripheral pulse.  NEUROLOGICAL:  She is intact.  Her ECG shows marked ST elevation in V1 and V3, I, and aVL with  reciprocal changes compatible with a large anterior myocardial infarction.  OVERALL IMPRESSION: 1. Acute anterior myocardial infarction. 2. History of hypertension.  PLAN:  We will proceed on with emergent cardiac catheterization. DD:  04/21/18 TD:  04/25/99 Job: [id number]

## 2010-08-05 NOTE — H&P (Signed)
[place]. [facility]  Patient:    [name], [name]                        MRN: [id number] Adm. Date:  11/09/99 Attending:  Colleen Can. Deborah Chalk, M.D. Dictator:   Jennet Maduro. Earl Gala, R.N., A.N.P. CC:         Winn [place]ock. Earl Gala, M.D.             Colleen Can. Deborah Chalk, M.D.                         History and Physical  CHIEF COMPLAINT: Exertional chest discomfort.  HISTORY OF PRESENT ILLNESS: [name] is a very pleasant 77 year old white female who has known atherosclerotic cardiovascular disease.  She has had previous myocardial infarction sustained in February 2001 and now presents for elective cardiac catheterization following an abnormal stress Cardiolite study.  The study was initially performed as a follow-up in light of her previous revascularization; however, she had had a several week history of exertional chest "flutters" that were associated with shortness of breath and relieved quite promptly with rest.  She has not used any sublingual nitroglycerin.  She was able to exercise on the standard Bruce protocol for a total of eight minutes to a maximum 3.4 mph at 14% grade.  Her heart rate increased to 130 and blood pressure rose to 150/80.  The test was stopped due to fatigue, and she did have some mild chest discomfort in the early recovery period which resolved rather promptly.  Her resting electrocardiogram showed changes compatible with anteroseptal myocardial infarction, yet with exercise she had ST-T wave changes that were suggestive of ischemia.  She is now referred for cardiac catheterization.  PAST MEDICAL HISTORY:  1. Atherosclerotic cardiovascular disease, with previous anterior wall     myocardial infarction, with stent deployment to the first diagonal in     February 2001.  She had residual coronary disease documented with 30-40%     LAD, 30-40% circumflex, and 30-40% right coronary artery.  2. Hypercholesterolemia.  She is currently on Zocor.   Her last lipid panel     in July 2001 showed a total cholesterol of 199, triglyceride 264, HDL 63,     and LDL 83.  3. Hypertension.  4. Status post cholecystectomy.  5. History of breast biopsies.  6. Hysterectomy.  ALLERGIES: No known drug allergies.  CURRENT MEDICATIONS:  1. Toprol XL 50 mg q.d.  2. Imdur 30 mg q.d.  3. Altace 2.5 mg q.d.  4. Calcium q.d.  5. Aspirin q.d.  6. Zocor 20 mg q.d.  7. Premarin q.d.  FAMILY HISTORY: Negative for heart disease.  SOCIAL HISTORY: She is married.  No smoking, no alcohol use.  REVIEW OF SYSTEMS: Basically negative otherwise.  PHYSICAL EXAMINATION:  GENERAL: She is very pleasant.  She was somewhat anxious.  At the time of her visit she was pain-free.  VITAL SIGNS: Blood pressure initially was 160/90 and at the end of the examination was 120/80.  Heart rate 80.  Respirations 20.  She is afebrile.  SKIN: Warm and dry.  Color unremarkable.  NECK: Supple.  No JVD.  LUNGS: Clear.  HEART: Regular rhythm.  No murmur.  ABDOMEN: Soft.  Positive bowel sounds.  Nontender.  EXTREMITIES: No evidence of edema.  NEUROLOGIC: Intact, with no gross focal deficits.  RECTAL: Examination deferred.  LABORATORY DATA: Laboratories are currently pending.  IMPRESSION: Recurrent chest  pain in the setting of known atherosclerotic cardiovascular disease with previous myocardial infarction, with revascularization.  PLAN: Will proceed on with elective cardiac catheterization.  The risks of the procedure and benefits have been explained to both the patient and her husband and she is willing to proceed. DD:  11/08/99 TD:  11/09/99 Job: [id number]

## 2010-08-05 NOTE — Cardiovascular Report (Signed)
[name]. [facility]  Patient:    [name], [name]                      MRN: [id number] Proc. Date: 11/09/99 Adm. Date:  [id number] Attending:  Eleanora Pearson                        Cardiac Catheterization  HISTORY:  Ms. [name] presented with an anterior lateral myocardial infarction on April 25, 1999.  She had 30% narrowing in her LAD, circumflex, and right coronary artery and a totally occluded diagonal vessel.  She had a 3.0 mm stent placed in the diagonal vessel.  She presents now with recurrent angina.  PROCEDURE:  Left heart catheterization with selective coronary angiography, left ventricular angiography, and angioplasty of the diagonal vessel with a Cutting Balloon.  TYPE AND SITE OF ENTRY:  Percutaneous right femoral artery.  CATHETERS:  A 6 French 4 curved Judkins right and left coronary catheters, 6 French pigtail ventriculographic catheter, 7 [place] JL 3.5 guide catheter, a long Extra Support guidewire, and a 2.75 x 10 mm Cutting Balloon.  CONTRAST MATERIAL:  Omnipaque.  MEDICATIONS GIVEN PRIOR TO THE PROCEDURE:  Valium 10 mg p.o.  MEDICATIONS GIVEN DURING THE PROCEDURE:  Integrilin, heparin, IV nitroglycerin, and Versed 2 mg IV.  COMMENTS:  The patient tolerated the procedure well.  HEMODYNAMIC DATA:  The aortic pressure was 140/61, LV is 140/19. There was no aortic valve gradient noted on pullback.  ANGIOGRAPHIC DATA: 1. Left main:  Left main coronary artery is normal. 2. Left circumflex:  The left circumflex continues as a long tortuous    obtuse marginal.  There was 30-40% narrowing proximally in the left    circumflex. 3. Left anterior descending:  The left anterior descending was a long vessel    that crossed the apex.  It had diffuse irregularities, no greater    than 30-40%.  There was a large first diagonal vessel with a stenosis    within the stent. 4. Right coronary artery:  The right coronary artery was a dominant  vessel.    There was 30-40% narrowings proximally, good satisfactory and    excellent distal flow.  There was one irregular area within the plaque    but it did not appear to compromise flow significantly.  LEFT VENTRICULOGRAPHY:  Left ventricular angiogram was performed in the RAO position.  Overall cardiac size and silhouette are normal.  There was anterior and lateral hypokinesis with a global ejection fraction of 40-45%.  ANGIOPLASTY PROCEDURE:  We changed for a 7 [place] FL 3.5 and an Extra Support guidewire.  This was passed down the diagonal vessel without difficulty.  We were able to position the 2.75 x 10 mm Cutting Balloon easily across the stent.  Because of the distal portion of the stent had not narrowed significantly, we elected not to try to do angioplasty on that portion of the stent, instead concentrating on the more proximal aspects of the stent back to the ostium of the diagonal.  The balloon was inflated on six different occasions and the results were felt to be excellent with satisfactory lumen within this stent and proximally back to the ostium in the unstented zone.  We did not proceed beyond the stent and into the distal left anterior descending because of the feeling the lumen was not significantly compromised there and that this was stable plaque.  However, there did remain at  least 30-40% narrowings beyond the stent in a segmental manner.  OVERALL IMPRESSION: 1. Successful angioplasty of a in-stent re-stenosis of the first    diagonal vessel. 2. Moderate diffuse three-vessel coronary atherosclerosis. 3. Anterolateral hypokinesia compatible with the previous anterolateral    myocardial infarction. DD:  11/09/99 TD:  11/09/99 Job: [id number]

## 2010-08-05 NOTE — Cardiovascular Report (Signed)
[name], [name] NO.:  [id number]   MEDICAL RECORD NO.:  [id number]          PATIENT TYPE:  OIB   LOCATION:  1962                         FACILITY:  [facility]   PHYSICIAN:  Colleen Can. Deborah Chalk, M.D.DATE OF BIRTH:  09-23-1932   DATE OF PROCEDURE:  12/06/2005  DATE OF DISCHARGE:                              CARDIAC CATHETERIZATION   HISTORY:  [name] had a previous stent in a high large diagonal vessel as  well as an anterior myocardial infarction in 2001.  She had a stress  Cardiolite study with global ejection fraction of 43% and anterior wall  motion defect.  Because of EKG changes and the fixed anterior defect, she is  referred for catheterization.   PROCEDURE:  Left heart catheterization with selective coronary angiography,  left ventricular angiography.   TYPE AND SITE OF ENTRY:  Percutaneous right femoral artery.   CATHETERS:  4-French 4 curved Judkins right and left coronary catheters, 4-  French pigtail ventriculographic catheter.   CONTRAST MATERIAL:  Omnipaque.   MEDICATIONS GIVEN PRIOR TO PROCEDURE:  Valium 10 mg p.o.   MEDICATIONS GIVEN DURING THE PROCEDURE:  Versed 2 mg IV.   COMMENTS:  The patient tolerated the procedure well.   HEMODYNAMIC DATA:  The aortic pressure was 118/58, LV is 138/4-7.  There is  no aortic valve gradient noted on pullback.   ANGIOGRAPHIC DATA:  1. The right coronary artery is a large dominant vessel.  There is      somewhat diffuse 30% narrowing scattered throughout the right coronary      artery but there is not obstructive disease.  It is a large right      coronary artery with a large posterior descending as well as a large      posterior lateral branch.  2. The left main coronary artery is normal.  3. The left circumflex continues mainly as a large obtuse marginal.  There      is somewhat focal 30-50% narrowing in the proximal portions.  The      vessel, itself, is somewhat tortuous and free of obstructive  disease.  4. The left anterior descending has a relatively large proximal diagonal      vessel.  Then the left anterior descending extends to and crosses the      apex.  The left anterior descending, itself, is a relatively small      vessel.  It had irregularities but no focal narrowing.  It becomes      approximately 2 mm in diameter as it crosses the apex.  The high      proximal diagonal vessel has a stent patent in its beginning portions.      Just beyond the stent, there is a 50-60% somewhat focal narrowing but      then there is irregularities in the remainder of the vessel.  It does      not appear that this narrowing after the stent is flow restrictive.  5. The left ventricular angiogram was performed in RAO position.  Overall      cardiac size is normal.  There was mid-anterior wall akinesis.  The      apical portion of the heart contracted reasonably well as well as did      the inferior portion.  The global ejection fraction will be between 40-      45%.  There is no mitral regurgitation, intracardiac calcification, or      intracavitary filling defects.   OVERALL IMPRESSION:  1. Moderate left ventricular dysfunction with anterior wall akinesia with      well preserved apical and inferior wall motion.  2. Somewhat diffuse three vessel coronary atherosclerosis without      significant focal stenosis.   DISCUSSION:  The findings on the stress Cardiolite study can be explained by  the previous anterior myocardial infarction.  It appears that [name] is  reasonably well revascularized.  It does not appear that she has critical  narrowings at any other juncture in her coronary anatomy but she will need  to be managed with aggressive risk modification.      Colleen Can. Deborah Chalk, M.D.  Electronically Signed     SNT/MEDQ  D:  12/06/2005  T:  12/07/2005  Job:  [id number]

## 2010-08-05 NOTE — H&P (Signed)
[name], Pearson NO.:  [id number]   MEDICAL RECORD NO.:  [id number]          PATIENT TYPE:  OIB   LOCATION:  1962                         FACILITY:  [facility]   PHYSICIAN:  Colleen Can. Deborah Chalk, M.D.DATE OF BIRTH:  1932-06-21   DATE OF ADMISSION:  12/06/2005  DATE OF DISCHARGE:  12/06/2005                                HISTORY & PHYSICAL   CHIEF COMPLAINT:  Fatigue.   HISTORY OF PRESENT ILLNESS:  [name] is a very pleasant 77 year old  white female who was referred for elective cardiac catheterization.  Clinically, she has been complaining of fatigue and malaise.  She has known  history of ischemic heart disease with previous anterior infarction 6 years  ago. She has not had chest pain.  She did undergo a stress Cardiolite study  on November 28, 2005, which was positive for EKG changes inferolaterally,  and she has a fixed anterior scar with reduction of ejection fraction to  43%.  She is now referred for repeat cardiac catheterization.   PAST MEDICAL HISTORY:  1. Known ischemic coronary artery disease with previous anterior      myocardial infarction.  She was treated with stent placement to the      first diagonal on April 25, 1999.  She subsequently had cutting      balloon angioplasty to the first diagonal in August 2001.  At the time      of her last catheterization, the other findings showed moderate diffuse      3-vessel disease with anterolateral hypokinesia.  2. Hyperlipidemia.  3. Hypertension.  4. Obesity.  5. Weakness and fatigue.  6. Status post cholecystectomy.  7. Hysterectomy.  8. Previous breast biopsies.   ALLERGIES:  None.   CURRENT MEDICATIONS:  1. Cozaar 100 mg a day.  2. Lipitor 10 mg a day.  3. Toprol XL 50 mg a day.  4. Imdur 30 mg a day.  5. Premarin daily.  6. Calcium daily.  7. Vitamin D daily.  8. Aspirin 325 mg every other day.   FAMILY HISTORY:  Negative for heart disease.   SOCIAL HISTORY:  She is married.   She has no alcohol or tobacco use.   REVIEW OF SYSTEMS:  As noted above.  She notes that she is very limited with  her ability to exercise due to hip pain.  She has not been successful with  weight loss.  She has had no actual chest pain.   PHYSICAL EXAMINATION:  GENERAL: She is a very pleasant white female in no  acute distress.  VITAL SIGNS: Blood pressure 146/90 sitting, 140/90 standing, heart rate 68.  Weight 142-1/2 pounds.  SKIN: Warm and dry.  Color unremarkable.  LUNGS:  Clear.  HEART: Shows a regular rhythm.  ABDOMEN: Soft, positive bowel sounds, nontender.  EXTREMITIES:  Without edema.  NEUROLOGIC: No gross focal deficits.   PERTINENT LABORATORY DATA:  Pending.   OVERALL IMPRESSION:  1. Abnormal stress Cardiolite study.  2. Known ischemic heart disease and previous anterior myocardial      infarction in 2001, treated with stenting of a first  diagonal with      subsequent cutting balloon angioplasty due to in-stent restenosis with      her last catheterization being August 2001.  3. Hyperlipidemia.  4. Hypertension.  5. Weakness and fatigue.   PLAN:  We will proceed on with diagnostic catheterization.  The procedure  has been reviewed in full detail, and she is willing to proceed on December 06, 2005.      Sharlee Blew, N.P.      Colleen Can. Deborah Chalk, M.D.  Electronically Signed    LC/MEDQ  D:  12/05/2005  T:  12/06/2005  Job:  [id number]

## 2010-08-18 ENCOUNTER — Other Ambulatory Visit: Payer: Self-pay | Admitting: *Deleted

## 2010-08-18 MED ORDER — ISOSORBIDE MONONITRATE ER 30 MG PO TB24: ORAL_TABLET | ORAL | Status: DC

## 2010-09-02 ENCOUNTER — Encounter: Payer: Self-pay | Admitting: *Deleted

## 2010-09-02 ENCOUNTER — Other Ambulatory Visit: Payer: Self-pay | Admitting: *Deleted

## 2010-09-02 DIAGNOSIS — E785 Hyperlipidemia, unspecified: Secondary | ICD-10-CM

## 2010-09-09 ENCOUNTER — Ambulatory Visit (INDEPENDENT_AMBULATORY_CARE_PROVIDER_SITE_OTHER): Payer: Medicare Other | Admitting: Nurse Practitioner

## 2010-09-09 ENCOUNTER — Telehealth: Payer: Self-pay | Admitting: *Deleted

## 2010-09-09 ENCOUNTER — Other Ambulatory Visit (INDEPENDENT_AMBULATORY_CARE_PROVIDER_SITE_OTHER): Payer: Medicare Other | Admitting: *Deleted

## 2010-09-09 ENCOUNTER — Encounter: Payer: Self-pay | Admitting: Nurse Practitioner

## 2010-09-09 VITALS — BP 130/70 | HR 60 | Wt 135.0 lb

## 2010-09-09 DIAGNOSIS — I10 Essential (primary) hypertension: Secondary | ICD-10-CM | POA: Insufficient documentation

## 2010-09-09 DIAGNOSIS — E785 Hyperlipidemia, unspecified: Secondary | ICD-10-CM | POA: Insufficient documentation

## 2010-09-09 DIAGNOSIS — I2510 Atherosclerotic heart disease of native coronary artery without angina pectoris: Secondary | ICD-10-CM

## 2010-09-09 LAB — LIPID PANEL
Cholesterol: 145 mg/dL (ref 0–200)
HDL: 47.1 mg/dL (ref >39.00)
LDL Cholesterol: 77 mg/dL (ref 0–99)
Total CHOL/HDL Ratio: 3
Triglycerides: 104 mg/dL (ref 0.0–149.0)
VLDL: 20.8 mg/dL (ref 0.0–40.0)

## 2010-09-09 LAB — BASIC METABOLIC PANEL WITH GFR
BUN: 17 mg/dL (ref 6–23)
CO2: 26 meq/L (ref 19–32)
Calcium: 9.4 mg/dL (ref 8.4–10.5)
Chloride: 108 meq/L (ref 96–112)
Creatinine, Ser: 0.8 mg/dL (ref 0.4–1.2)
GFR: 72.08 mL/min
Glucose, Bld: 94 mg/dL (ref 70–99)
Potassium: 4.1 meq/L (ref 3.5–5.1)
Sodium: 142 meq/L (ref 135–145)

## 2010-09-09 LAB — HEPATIC FUNCTION PANEL
ALT: 18 U/L (ref 0–35)
AST: 21 U/L (ref 0–37)
Albumin: 3.9 g/dL (ref 3.5–5.2)
Alkaline Phosphatase: 64 U/L (ref 39–117)
Bilirubin, Direct: 0.2 mg/dL (ref 0.0–0.3)
Total Bilirubin: 0.6 mg/dL (ref 0.3–1.2)
Total Protein: 6.6 g/dL (ref 6.0–8.3)

## 2010-09-09 NOTE — Progress Notes (Signed)
[name] Date of Birth: 08-05-1932   History of Present Illness: [name] is seen today for her 6 month visit. She is seen for Dr. Sanjuana Pearson. She is a former patient of Dr. Ronnald Pearson. She continues to do well. She is not having chest pain. She is not short of breath. She continues to exercise at a [employer]. She is tolerating her medicines but she would like to stop some of them, especially her statin. I have explained the importance of statin therapy to her.   Current Outpatient Prescriptions on File Prior to Visit  Medication Sig Dispense Refill  . aspirin 81 MG tablet Take 81 mg by mouth daily.        . calcium carbonate (OS-CAL) 600 MG TABS Take 600 mg by mouth daily.        . Cholecalciferol (VITAMIN D3) 1000 UNITS CAPS Take 1,000 mg by mouth daily.        Marland Kitchen estrogens, conjugated, (PREMARIN) 0.3 MG tablet Take 0.3 mg by mouth every 3 (three) days. Take daily for 21 days then do not take for 7 days.       . isosorbide mononitrate (IMDUR) 30 MG 24 hr tablet 1/2 tablet po daily  15 tablet  6  . losartan-hydrochlorothiazide (HYZAAR) 100-12.5 MG per tablet Take 1 tablet by mouth daily.        . metoprolol (TOPROL XL) 50 MG 24 hr tablet Take 1 tablet (50 mg total) by mouth daily.  30 tablet  6  . Omega-3 Fatty Acids (FISH OIL) 1200 MG CAPS Take 1,200 mg by mouth daily.        . simvastatin (ZOCOR) 20 MG tablet Take 20 mg by mouth at bedtime.        Marland Kitchen DISCONTD: vitamin E 400 UNIT capsule Take 400 Units by mouth daily.          Allergies  Allergen Reactions  . Alendronate Sodium     REACTION: Reaction not known  . Risedronate Sodium     REACTION: Reaction not known    Past Medical History  Diagnosis Date  . Coronary artery disease     had diffuse three-vessel coronary disease without focal stenosis at that time -- Last cath was in 2007showed anterior wall hypokinesis with well-preserved apical and inferior wall motion with and EF of approximately 45% -- last cardiolite study  12/09 this showed an EF of 47% with anterior akinesis, anterior scar , & no ischemia general  . Hypercholesterolemia   . History of hysterectomy   . Shortness of breath   . Forgetfulness   . History of shingles   . History of acute anterior wall myocardial infarction 04/25/1999    with a stent placement in her first diagonal vessel, and subsequent cutting balloon to the diagonal in 2001  . Ischemic heart disease   . Hypertension   . History of atherosclerotic cardiovascular disease   . Fatigue   . Weakness     Past Surgical History  Procedure Date  . Cardiac catheterization 2007    showed anterior wall hypokinesis with well-preserved apical and inferior wall motion with and EF of approximately 45%         . Cholecystectomy   . Breast biopsy   . Cardiac catheterization 2001    EF of 40-45% -- 30-40% LAD, 30-40% circumflex, and 30-40% right coronary artery --  with stent deployment to the first diagonal in    History  Smoking status  . Never  Smoker   Smokeless tobacco  . Not on file    History  Alcohol Use No    Family History  Problem Relation Age of Onset  . Heart disease Neg Hx     Review of Systems: The review of systems is positive for some mild forgetfulness. No falls. She is not lightheaded or dizzy. No syncope. No chest pain.  All other systems were reviewed and are negative.  Physical Exam: BP 130/70  Pulse 60  Wt 135 lb (61.236 kg) Patient is very pleasant and in no acute distress. Skin is warm and dry. Color is normal.  HEENT is unremarkable. Normocephalic/atraumatic. PERRL. Sclera are nonicteric. Neck is supple. No masses. No JVD. Lungs are clear. Cardiac exam shows a regular rate and rhythm. Abdomen is soft. Extremities are without edema. Gait and ROM are intact. No gross neurologic deficits noted.  LABORATORY DATA: Pending   Assessment / Plan:

## 2010-09-09 NOTE — Telephone Encounter (Signed)
Message copied by Adolphus Birchwood on Fri Sep 09, 2010  4:43 PM ------      Message from: Rosalio Macadamia      Created: Fri Sep 09, 2010  1:33 PM       Ok to report. Labs are satisfactory.  Stay on same medicines. Recheck BMET/HPF/LIPIDS  in 6 months.

## 2010-09-09 NOTE — Telephone Encounter (Signed)
Pt notified of lab results and to continue the same medications.  Pt will have labs in six months with Dr. Clifton James.

## 2010-09-09 NOTE — Assessment & Plan Note (Signed)
She is doing well. No change in her medicines. We will see her back in 6 months and she will see Dr. Sanjuana Kava in 6 months. Patient is agreeable to this plan and will call if any problems develop in the interim.

## 2010-09-09 NOTE — Assessment & Plan Note (Signed)
Blood pressure is good and is satisfactory at home. We will continue with her current regimen.

## 2010-09-09 NOTE — Assessment & Plan Note (Signed)
She remains on statin. Labs are checked today and we will recheck on return.

## 2010-09-09 NOTE — Patient Instructions (Signed)
Stay on your current medicines. We will call you with your lab results I will have you see Dr. Doylene Bode in 6 months with fasting labs Call for any problems

## 2010-10-25 ENCOUNTER — Other Ambulatory Visit (HOSPITAL_COMMUNITY): Payer: Self-pay | Admitting: Internal Medicine

## 2010-10-25 DIAGNOSIS — Z1231 Encounter for screening mammogram for malignant neoplasm of breast: Secondary | ICD-10-CM

## 2010-11-09 ENCOUNTER — Ambulatory Visit (HOSPITAL_COMMUNITY)
Admission: RE | Admit: 2010-11-09 | Discharge: 2010-11-09 | Disposition: A | Discharge status: Home / Self Care | Payer: Medicare Other | Source: Ambulatory Visit | Attending: Internal Medicine | Admitting: Internal Medicine

## 2010-11-09 DIAGNOSIS — Z1231 Encounter for screening mammogram for malignant neoplasm of breast: Secondary | ICD-10-CM

## 2010-12-13 ENCOUNTER — Other Ambulatory Visit: Payer: Self-pay | Admitting: *Deleted

## 2010-12-13 ENCOUNTER — Other Ambulatory Visit: Payer: Self-pay | Admitting: Cardiology

## 2010-12-13 MED ORDER — SIMVASTATIN 40 MG PO TABS: ORAL_TABLET | ORAL | Status: DC

## 2010-12-13 NOTE — Telephone Encounter (Signed)
Pt called and confirmed strength and dose, refill completed. Pt wanted 40 mg tab and will cut in half.

## 2011-02-10 ENCOUNTER — Other Ambulatory Visit: Payer: Self-pay | Admitting: Cardiology

## 2011-02-10 ENCOUNTER — Other Ambulatory Visit: Payer: Self-pay

## 2011-02-10 MED ORDER — METOPROLOL SUCCINATE ER 50 MG PO TB24: 50.0000 mg | ORAL_TABLET | Freq: Every day | ORAL | Status: DC

## 2011-02-10 NOTE — Telephone Encounter (Signed)
.   Requested Prescriptions   Signed Prescriptions Disp Refills  . metoprolol (TOPROL-XL) 50 MG 24 hr tablet 30 tablet 6    Sig: Take 1 tablet (50 mg total) by mouth daily.    Authorizing Provider: Verne Carrow    Ordering User: Lacie Scotts

## 2011-03-08 ENCOUNTER — Encounter: Payer: Self-pay | Admitting: Cardiovascular Disease

## 2011-03-08 ENCOUNTER — Ambulatory Visit (INDEPENDENT_AMBULATORY_CARE_PROVIDER_SITE_OTHER): Payer: Medicare Other | Admitting: Cardiovascular Disease

## 2011-03-08 VITALS — BP 128/64 | HR 92 | Ht 63.0 in | Wt 133.0 lb

## 2011-03-08 DIAGNOSIS — I10 Essential (primary) hypertension: Secondary | ICD-10-CM

## 2011-03-08 DIAGNOSIS — E785 Hyperlipidemia, unspecified: Secondary | ICD-10-CM

## 2011-03-08 DIAGNOSIS — I2510 Atherosclerotic heart disease of native coronary artery without angina pectoris: Secondary | ICD-10-CM

## 2011-03-08 NOTE — Assessment & Plan Note (Signed)
BP stable. Continue current therapy.

## 2011-03-08 NOTE — Assessment & Plan Note (Signed)
Will check lipids and LFTs. Continue statin.  

## 2011-03-08 NOTE — Patient Instructions (Signed)
Your physician wants you to follow-up in:  6 months. You will receive a reminder letter in the mail two months in advance. If you don't receive a letter, please call our office to schedule the follow-up appointment.   

## 2011-03-08 NOTE — Assessment & Plan Note (Signed)
Stable No changes 

## 2011-03-08 NOTE — Progress Notes (Signed)
History of Present Illness: 77 yo WF with history of CAD, HTN, HLD here today for cardiac follow up. She has been followed in the past by Dr. Deborah Chalk. She had an anterolateral MI in 2001 with stent placement in the Diagonal branch of the LAD. Last cath was in 2007. Last stress test was December 2009 with LvEF of 47%, anterior akinesis, anterior scar and no ischemia.   Past Medical History  Diagnosis Date  . Coronary artery disease     had diffuse three-vessel coronary disease without focal stenosis at that time -- Last cath was in 2007showed anterior wall hypokinesis with well-preserved apical and inferior wall motion with and EF of approximately 45% -- last cardiolite study 12/09 this showed an EF of 47% with anterior akinesis, anterior scar , & no ischemia general  . Hypercholesterolemia   . History of shingles   . History of acute anterior wall myocardial infarction 04/25/1999    with a stent placement in her first diagonal vessel, and subsequent cutting balloon to the diagonal in 2001  . Hypertension   . Fatigue   . Weakness     Past Surgical History  Procedure Date  . Cholecystectomy   . Breast biopsy     Bilateral  . Abdominal hysterectomy     Current Outpatient Prescriptions  Medication Sig Dispense Refill  . aspirin 81 MG tablet Take 81 mg by mouth daily.        . calcium carbonate (OS-CAL) 600 MG TABS Take 600 mg by mouth daily.        . Cholecalciferol (VITAMIN D3) 1000 UNITS CAPS Take 1,000 mg by mouth daily.        Marland Kitchen estrogens, conjugated, (PREMARIN) 0.3 MG tablet Take 0.3 mg by mouth every 3 (three) days. Take daily for 21 days then do not take for 7 days.       . isosorbide mononitrate (IMDUR) 30 MG 24 hr tablet 1/2 tablet po daily  15 tablet  6  . losartan-hydrochlorothiazide (HYZAAR) 100-12.5 MG per tablet Take 1 tablet by mouth daily.        . metoprolol (TOPROL-XL) 50 MG 24 hr tablet Take 1 tablet (50 mg total) by mouth daily.  30 tablet  6  . Omega-3 Fatty Acids  (FISH OIL) 1200 MG CAPS Take 1,200 mg by mouth daily.        . simvastatin (ZOCOR) 40 MG tablet Take a half a tablet (20 mg) each night.  30 tablet  1    Allergies  Allergen Reactions  . Alendronate Sodium     REACTION: Reaction not known  . Risedronate Sodium     REACTION: Reaction not known    History   Social History  . Marital Status: Married    Spouse Name: N/A    Number of Children: N/A  . Years of Education: N/A   Occupational History  . Not on file.   Social History Main Topics  . Smoking status: Never Smoker   . Smokeless tobacco: Not on file  . Alcohol Use: No  . Drug Use: No  . Sexually Active: Not on file   Other Topics Concern  . Not on file   Social History Narrative  . No narrative on file    Family History  Problem Relation Age of Onset  . Heart disease Neg Hx     Review of Systems:  As stated in the HPI and otherwise negative.   BP 128/64  Pulse 92  Ht 5\' 3"  (1.6 m)  Wt 133 lb (60.328 kg)  BMI 23.56 kg/m2  Physical Examination: General: Well developed, well nourished, NAD HEENT: OP clear, mucus membranes moist SKIN: warm, dry. No rashes. Neuro: No focal deficits Musculoskeletal: Muscle strength 5/5 all ext Psychiatric: Mood and affect normal Neck: No JVD, no carotid bruits, no thyromegaly, no lymphadenopathy. Lungs:Clear bilaterally, no wheezes, rhonci, crackles Cardiovascular: Regular rate and rhythm. No murmurs, gallops or rubs. Abdomen:Soft. Bowel sounds present. Non-tender.  Extremities: No lower extremity edema. Pulses are 2 + in the bilateral DP/PT.  EKG: NSR, rate 92 bpm. LAE. Incomplete RBBB

## 2011-03-20 ENCOUNTER — Other Ambulatory Visit: Payer: Self-pay | Admitting: Cardiovascular Disease

## 2011-03-20 ENCOUNTER — Other Ambulatory Visit (INDEPENDENT_AMBULATORY_CARE_PROVIDER_SITE_OTHER): Payer: Medicare Other | Admitting: *Deleted

## 2011-03-20 DIAGNOSIS — E785 Hyperlipidemia, unspecified: Secondary | ICD-10-CM

## 2011-03-20 LAB — HEPATIC FUNCTION PANEL
ALT: 17 U/L (ref 0–35)
AST: 25 U/L (ref 0–37)
Albumin: 3.7 g/dL (ref 3.5–5.2)
Alkaline Phosphatase: 77 U/L (ref 39–117)
Bilirubin, Direct: 0 mg/dL (ref 0.0–0.3)
Total Bilirubin: 0.5 mg/dL (ref 0.3–1.2)
Total Protein: 6.9 g/dL (ref 6.0–8.3)

## 2011-03-20 LAB — BASIC METABOLIC PANEL
BUN: 15 mg/dL (ref 6–23)
CO2: 22 mEq/L (ref 19–32)
Calcium: 9.1 mg/dL (ref 8.4–10.5)
Chloride: 109 mEq/L (ref 96–112)
Creatinine, Ser: 0.9 mg/dL (ref 0.4–1.2)
GFR: 63.83 mL/min (ref >60.00)
Glucose, Bld: 93 mg/dL (ref 70–99)
Potassium: 3.9 mEq/L (ref 3.5–5.1)
Sodium: 140 mEq/L (ref 135–145)

## 2011-03-20 LAB — LIPID PANEL
Cholesterol: 141 mg/dL (ref 0–200)
HDL: 45.5 mg/dL (ref >39.00)
LDL Cholesterol: 59 mg/dL (ref 0–99)
Total CHOL/HDL Ratio: 3
Triglycerides: 181 mg/dL — ABNORMAL HIGH (ref 0.0–149.0)
VLDL: 36.2 mg/dL (ref 0.0–40.0)

## 2011-03-28 ENCOUNTER — Other Ambulatory Visit: Payer: Medicare Other | Admitting: *Deleted

## 2011-03-28 ENCOUNTER — Telehealth: Payer: Self-pay | Admitting: Cardiovascular Disease

## 2011-03-28 DIAGNOSIS — I2510 Atherosclerotic heart disease of native coronary artery without angina pectoris: Secondary | ICD-10-CM

## 2011-03-28 DIAGNOSIS — E785 Hyperlipidemia, unspecified: Secondary | ICD-10-CM

## 2011-03-28 NOTE — Telephone Encounter (Signed)
New Msg: Pt calling to speak with Dennie Bible. Please return pt call to discuss further.

## 2011-03-28 NOTE — Telephone Encounter (Signed)
Spoke with pt and reviewed lab results and let her know copy has been mailed to her. She is due for repeat labs and office visit in 6 months. She will schedule early AM appt with Dr. Clifton James and come in fasting that day.

## 2011-04-21 ENCOUNTER — Other Ambulatory Visit: Payer: Self-pay | Admitting: Cardiovascular Disease

## 2011-04-28 ENCOUNTER — Other Ambulatory Visit: Payer: Self-pay | Admitting: Cardiovascular Disease

## 2011-05-19 DIAGNOSIS — H52209 Unspecified astigmatism, unspecified eye: Secondary | ICD-10-CM | POA: Diagnosis not present

## 2011-05-19 DIAGNOSIS — H2510 Age-related nuclear cataract, unspecified eye: Secondary | ICD-10-CM | POA: Diagnosis not present

## 2011-05-19 DIAGNOSIS — H43819 Vitreous degeneration, unspecified eye: Secondary | ICD-10-CM | POA: Diagnosis not present

## 2011-05-19 DIAGNOSIS — H25019 Cortical age-related cataract, unspecified eye: Secondary | ICD-10-CM | POA: Diagnosis not present

## 2011-07-12 DIAGNOSIS — Z124 Encounter for screening for malignant neoplasm of cervix: Secondary | ICD-10-CM | POA: Diagnosis not present

## 2011-07-12 DIAGNOSIS — Z0000 Encounter for general adult medical examination without abnormal findings: Secondary | ICD-10-CM | POA: Diagnosis not present

## 2011-08-03 DIAGNOSIS — I252 Old myocardial infarction: Secondary | ICD-10-CM | POA: Diagnosis not present

## 2011-08-03 DIAGNOSIS — Z0000 Encounter for general adult medical examination without abnormal findings: Secondary | ICD-10-CM | POA: Diagnosis not present

## 2011-08-03 DIAGNOSIS — Z1331 Encounter for screening for depression: Secondary | ICD-10-CM | POA: Diagnosis not present

## 2011-08-03 DIAGNOSIS — I2510 Atherosclerotic heart disease of native coronary artery without angina pectoris: Secondary | ICD-10-CM | POA: Diagnosis not present

## 2011-08-03 DIAGNOSIS — M810 Age-related osteoporosis without current pathological fracture: Secondary | ICD-10-CM | POA: Diagnosis not present

## 2011-09-18 ENCOUNTER — Other Ambulatory Visit: Payer: Self-pay

## 2011-09-19 ENCOUNTER — Other Ambulatory Visit (INDEPENDENT_AMBULATORY_CARE_PROVIDER_SITE_OTHER): Payer: Medicare Other

## 2011-09-19 DIAGNOSIS — E785 Hyperlipidemia, unspecified: Secondary | ICD-10-CM | POA: Diagnosis not present

## 2011-09-19 DIAGNOSIS — I2510 Atherosclerotic heart disease of native coronary artery without angina pectoris: Secondary | ICD-10-CM | POA: Diagnosis not present

## 2011-09-19 LAB — BASIC METABOLIC PANEL
BUN: 18 mg/dL (ref 6–23)
CO2: 24 mEq/L (ref 19–32)
Calcium: 9.5 mg/dL (ref 8.4–10.5)
Chloride: 108 mEq/L (ref 96–112)
Creatinine, Ser: 0.8 mg/dL (ref 0.4–1.2)
GFR: 69.91 mL/min (ref >60.00)
Glucose, Bld: 91 mg/dL (ref 70–99)
Potassium: 3.6 mEq/L (ref 3.5–5.1)
Sodium: 142 mEq/L (ref 135–145)

## 2011-09-19 LAB — HEPATIC FUNCTION PANEL
ALT: 16 U/L (ref 0–35)
AST: 24 U/L (ref 0–37)
Albumin: 3.9 g/dL (ref 3.5–5.2)
Alkaline Phosphatase: 53 U/L (ref 39–117)
Bilirubin, Direct: 0.2 mg/dL (ref 0.0–0.3)
Total Bilirubin: 0.9 mg/dL (ref 0.3–1.2)
Total Protein: 7.3 g/dL (ref 6.0–8.3)

## 2011-09-19 LAB — LIPID PANEL
Cholesterol: 151 mg/dL (ref 0–200)
HDL: 53.9 mg/dL (ref >39.00)
LDL Cholesterol: 67 mg/dL (ref 0–99)
Total CHOL/HDL Ratio: 3
Triglycerides: 151 mg/dL — ABNORMAL HIGH (ref 0.0–149.0)
VLDL: 30.2 mg/dL (ref 0.0–40.0)

## 2011-10-09 ENCOUNTER — Other Ambulatory Visit: Payer: Self-pay | Admitting: Cardiovascular Disease

## 2011-10-10 ENCOUNTER — Other Ambulatory Visit: Payer: Self-pay | Admitting: Cardiovascular Disease

## 2011-10-11 NOTE — Telephone Encounter (Signed)
..   Requested Prescriptions   Signed Prescriptions Disp Refills  . metoprolol succinate (TOPROL-XL) 50 MG 24 hr tablet 30 tablet 3    Sig: TAKE 1 TABLET BY MOUTH DAILY    Authorizing Provider: Verne Carrow D    Ordering User: Lacie Scotts  . isosorbide mononitrate (IMDUR) 30 MG 24 hr tablet 15 tablet 3    Sig: TAKE 1/2 TABLET BY MOUTH EVERY DAY    Authorizing Provider: Verne Carrow D    Ordering User: Iven Finn S   Pt has a appt 10/31/11 w/Dr. Clifton James for 6 mo f/u.

## 2011-10-19 DIAGNOSIS — K5730 Diverticulosis of large intestine without perforation or abscess without bleeding: Secondary | ICD-10-CM | POA: Diagnosis not present

## 2011-10-19 DIAGNOSIS — Z1211 Encounter for screening for malignant neoplasm of colon: Secondary | ICD-10-CM | POA: Diagnosis not present

## 2011-10-30 ENCOUNTER — Ambulatory Visit (INDEPENDENT_AMBULATORY_CARE_PROVIDER_SITE_OTHER): Payer: Medicare Other | Admitting: Cardiovascular Disease

## 2011-10-30 ENCOUNTER — Encounter: Payer: Self-pay | Admitting: Cardiovascular Disease

## 2011-10-30 VITALS — BP 120/67 | HR 62 | Ht 64.0 in | Wt 134.0 lb

## 2011-10-30 DIAGNOSIS — I2510 Atherosclerotic heart disease of native coronary artery without angina pectoris: Secondary | ICD-10-CM | POA: Diagnosis not present

## 2011-10-30 MED ORDER — NITROGLYCERIN 0.4 MG SL SUBL: 0.4000 mg | SUBLINGUAL_TABLET | SUBLINGUAL | Status: DC | PRN

## 2011-10-30 NOTE — Assessment & Plan Note (Signed)
Stable. No chest pain. BP and lipids are well controlled. No changes today.

## 2011-10-30 NOTE — Patient Instructions (Addendum)
Your physician wants you to follow-up in:  12 months.  You will receive a reminder letter in the mail two months in advance. If you don't receive a letter, please call our office to schedule the follow-up appointment.   

## 2011-10-30 NOTE — Progress Notes (Signed)
History of Present Illness: 78 yo WF with history of CAD, HTN, HLD here today for cardiac follow up. She has been followed in the past by Dr. Deborah Chalk. She had an anterolateral MI in 2001 with stent placement in the Diagonal branch of the LAD. Last cath was in 2007. Last stress test was December 2009 with LvEF of 47%, anterior akinesis, anterior scar and no ischemia.   She is here for f/u. She has been feeling well. She has had no chest pain or SOB.    Primary Care Physician: Earl Gala  Last Lipid Profile:  Lipid Panel     Component Value Date/Time   CHOL 151 09/19/2011 0846   TRIG 151.0* 09/19/2011 0846   HDL 53.90 09/19/2011 0846   CHOLHDL 3 09/19/2011 0846   VLDL 30.2 09/19/2011 0846   LDLCALC 67 09/19/2011 0846     Past Medical History  Diagnosis Date  . Coronary artery disease     had diffuse three-vessel coronary disease without focal stenosis at that time -- Last cath was in 2007showed anterior wall hypokinesis with well-preserved apical and inferior wall motion with and EF of approximately 45% -- last cardiolite study 12/09 this showed an EF of 47% with anterior akinesis, anterior scar , & no ischemia general  . Hypercholesterolemia   . History of shingles   . History of acute anterior wall myocardial infarction 04/25/1999    with a stent placement in her first diagonal vessel, and subsequent cutting balloon to the diagonal in 2001  . Hypertension   . Fatigue   . Weakness     Past Surgical History  Procedure Date  . Cholecystectomy   . Breast biopsy     Bilateral  . Abdominal hysterectomy     Current Outpatient Prescriptions  Medication Sig Dispense Refill  . aspirin 81 MG tablet Take 81 mg by mouth daily.        . calcium carbonate (OS-CAL) 600 MG TABS Take 600 mg by mouth daily.        . Cholecalciferol (VITAMIN D3) 1000 UNITS CAPS Take 1,000 mg by mouth daily.        Marland Kitchen estrogens, conjugated, (PREMARIN) 0.3 MG tablet Take 0.3 mg by mouth every 3 (three) days. Take daily  for 21 days then do not take for 7 days.       . isosorbide mononitrate (IMDUR) 30 MG 24 hr tablet TAKE 1/2 TABLET BY MOUTH EVERY DAY  15 tablet  3  . losartan-hydrochlorothiazide (HYZAAR) 100-12.5 MG per tablet TAKE 1 TABLET BY MOUTH EVERY DAY  30 tablet  11  . metoprolol succinate (TOPROL-XL) 50 MG 24 hr tablet TAKE 1 TABLET BY MOUTH DAILY  30 tablet  3  . Omega-3 Fatty Acids (FISH OIL) 1200 MG CAPS Take 1,200 mg by mouth daily.        . simvastatin (ZOCOR) 40 MG tablet TAKE 1/2 TABLET BY MOUTH AT BEDTIME  30 tablet  5  . DISCONTD: metoprolol succinate (TOPROL-XL) 50 MG 24 hr tablet TAKE 1 TABLET BY MOUTH DAILY  30 tablet  1    Allergies  Allergen Reactions  . Alendronate Sodium     REACTION: Reaction not known  . Risedronate Sodium     REACTION: Reaction not known    History   Social History  . Marital Status: Married    Spouse Name: N/A    Number of Children: 3  . Years of Education: N/A   Occupational History  . Retired-[employer]    Social History Main Topics  . Smoking status: Never Smoker   . Smokeless tobacco: Not on file  . Alcohol Use: No  . Drug Use: No  . Sexually Active: Not on file   Other Topics Concern  . Not on file   Social History Narrative  . No narrative on file    Family History  Problem Relation Age of Onset  . Heart disease Neg Hx     Review of Systems:  As stated in the HPI and otherwise negative.   BP 120/67  Pulse 62  Ht 5\' 4"  (1.626 m)  Wt 134 lb (60.782 kg)  BMI 23.00 kg/m2  Physical Examination: General: Well developed, well nourished, NAD HEENT: OP clear, mucus membranes moist SKIN: warm, dry. No rashes. Neuro: No focal deficits Musculoskeletal: Muscle strength 5/5 all ext Psychiatric: Mood and affect normal Neck: No JVD, no carotid bruits, no thyromegaly, no lymphadenopathy. Lungs:Clear bilaterally, no wheezes, rhonci, crackles Cardiovascular: Regular rate and rhythm. No murmurs, gallops or rubs. Abdomen:Soft.  Bowel sounds present. Non-tender.  Extremities: No lower extremity edema. Pulses are 2 + in the bilateral DP/PT.

## 2011-12-04 ENCOUNTER — Other Ambulatory Visit (HOSPITAL_COMMUNITY): Payer: Self-pay | Admitting: Internal Medicine

## 2011-12-04 DIAGNOSIS — Z1231 Encounter for screening mammogram for malignant neoplasm of breast: Secondary | ICD-10-CM

## 2011-12-14 ENCOUNTER — Ambulatory Visit (HOSPITAL_COMMUNITY)
Admission: RE | Admit: 2011-12-14 | Discharge: 2011-12-14 | Disposition: A | Discharge status: Home / Self Care | Payer: Medicare Other | Source: Ambulatory Visit | Attending: Internal Medicine | Admitting: Internal Medicine

## 2011-12-14 DIAGNOSIS — Z1231 Encounter for screening mammogram for malignant neoplasm of breast: Secondary | ICD-10-CM

## 2011-12-19 DIAGNOSIS — H5200 Hypermetropia, unspecified eye: Secondary | ICD-10-CM | POA: Diagnosis not present

## 2011-12-19 DIAGNOSIS — H25019 Cortical age-related cataract, unspecified eye: Secondary | ICD-10-CM | POA: Diagnosis not present

## 2011-12-19 DIAGNOSIS — H04129 Dry eye syndrome of unspecified lacrimal gland: Secondary | ICD-10-CM | POA: Diagnosis not present

## 2011-12-19 DIAGNOSIS — H2510 Age-related nuclear cataract, unspecified eye: Secondary | ICD-10-CM | POA: Diagnosis not present

## 2011-12-27 DIAGNOSIS — H2589 Other age-related cataract: Secondary | ICD-10-CM | POA: Diagnosis not present

## 2011-12-27 DIAGNOSIS — H25019 Cortical age-related cataract, unspecified eye: Secondary | ICD-10-CM | POA: Diagnosis not present

## 2011-12-27 DIAGNOSIS — H2510 Age-related nuclear cataract, unspecified eye: Secondary | ICD-10-CM | POA: Diagnosis not present

## 2012-01-04 DIAGNOSIS — Z23 Encounter for immunization: Secondary | ICD-10-CM | POA: Diagnosis not present

## 2012-01-17 DIAGNOSIS — H25019 Cortical age-related cataract, unspecified eye: Secondary | ICD-10-CM | POA: Diagnosis not present

## 2012-01-17 DIAGNOSIS — H2589 Other age-related cataract: Secondary | ICD-10-CM | POA: Diagnosis not present

## 2012-01-17 DIAGNOSIS — H2510 Age-related nuclear cataract, unspecified eye: Secondary | ICD-10-CM | POA: Diagnosis not present

## 2012-02-08 ENCOUNTER — Other Ambulatory Visit: Payer: Self-pay | Admitting: Cardiovascular Disease

## 2012-02-12 ENCOUNTER — Other Ambulatory Visit: Payer: Self-pay | Admitting: Cardiovascular Disease

## 2012-03-05 DIAGNOSIS — M1710 Unilateral primary osteoarthritis, unspecified knee: Secondary | ICD-10-CM | POA: Diagnosis not present

## 2012-04-12 ENCOUNTER — Other Ambulatory Visit: Payer: Self-pay | Admitting: Cardiovascular Disease

## 2012-05-07 ENCOUNTER — Other Ambulatory Visit: Payer: Self-pay | Admitting: Cardiovascular Disease

## 2012-05-09 ENCOUNTER — Other Ambulatory Visit: Payer: Self-pay | Admitting: Cardiovascular Disease

## 2012-06-13 ENCOUNTER — Other Ambulatory Visit: Payer: Self-pay | Admitting: Cardiovascular Disease

## 2012-06-14 ENCOUNTER — Other Ambulatory Visit: Payer: Self-pay | Admitting: Cardiology

## 2012-06-14 DIAGNOSIS — R05 Cough: Secondary | ICD-10-CM | POA: Diagnosis not present

## 2012-06-14 DIAGNOSIS — R5381 Other malaise: Secondary | ICD-10-CM | POA: Diagnosis not present

## 2012-06-14 DIAGNOSIS — R062 Wheezing: Secondary | ICD-10-CM | POA: Diagnosis not present

## 2012-06-14 DIAGNOSIS — R5383 Other fatigue: Secondary | ICD-10-CM | POA: Diagnosis not present

## 2012-06-14 DIAGNOSIS — R059 Cough, unspecified: Secondary | ICD-10-CM | POA: Diagnosis not present

## 2012-06-14 MED ORDER — METOPROLOL SUCCINATE ER 50 MG PO TB24: 50.0000 mg | ORAL_TABLET | Freq: Every day | ORAL | Status: DC

## 2012-06-14 MED ORDER — ISOSORBIDE MONONITRATE ER 30 MG PO TB24: 15.0000 mg | ORAL_TABLET | Freq: Every day | ORAL | Status: DC

## 2012-06-20 DIAGNOSIS — R5381 Other malaise: Secondary | ICD-10-CM | POA: Diagnosis not present

## 2012-06-20 DIAGNOSIS — E039 Hypothyroidism, unspecified: Secondary | ICD-10-CM | POA: Diagnosis not present

## 2012-06-20 DIAGNOSIS — R5383 Other fatigue: Secondary | ICD-10-CM | POA: Diagnosis not present

## 2012-08-01 DIAGNOSIS — E039 Hypothyroidism, unspecified: Secondary | ICD-10-CM | POA: Diagnosis not present

## 2012-08-05 DIAGNOSIS — Z0000 Encounter for general adult medical examination without abnormal findings: Secondary | ICD-10-CM | POA: Diagnosis not present

## 2012-08-05 DIAGNOSIS — Z1331 Encounter for screening for depression: Secondary | ICD-10-CM | POA: Diagnosis not present

## 2012-08-05 DIAGNOSIS — M810 Age-related osteoporosis without current pathological fracture: Secondary | ICD-10-CM | POA: Diagnosis not present

## 2012-08-05 DIAGNOSIS — E039 Hypothyroidism, unspecified: Secondary | ICD-10-CM | POA: Diagnosis not present

## 2012-08-05 DIAGNOSIS — I10 Essential (primary) hypertension: Secondary | ICD-10-CM | POA: Diagnosis not present

## 2012-08-05 DIAGNOSIS — I2510 Atherosclerotic heart disease of native coronary artery without angina pectoris: Secondary | ICD-10-CM | POA: Diagnosis not present

## 2012-08-05 DIAGNOSIS — I252 Old myocardial infarction: Secondary | ICD-10-CM | POA: Diagnosis not present

## 2012-08-26 DIAGNOSIS — D3130 Benign neoplasm of unspecified choroid: Secondary | ICD-10-CM | POA: Diagnosis not present

## 2012-11-01 ENCOUNTER — Ambulatory Visit: Payer: Self-pay | Admitting: Cardiovascular Disease

## 2012-11-11 ENCOUNTER — Other Ambulatory Visit: Payer: Self-pay

## 2012-11-11 MED ORDER — ISOSORBIDE MONONITRATE ER 30 MG PO TB24: 15.0000 mg | ORAL_TABLET | Freq: Every day | ORAL | Status: DC

## 2012-11-11 MED ORDER — METOPROLOL SUCCINATE ER 50 MG PO TB24: 50.0000 mg | ORAL_TABLET | Freq: Every day | ORAL | Status: DC

## 2012-11-27 ENCOUNTER — Other Ambulatory Visit (HOSPITAL_COMMUNITY): Payer: Self-pay | Admitting: Internal Medicine

## 2012-11-27 DIAGNOSIS — Z1231 Encounter for screening mammogram for malignant neoplasm of breast: Secondary | ICD-10-CM

## 2012-11-28 ENCOUNTER — Ambulatory Visit (INDEPENDENT_AMBULATORY_CARE_PROVIDER_SITE_OTHER): Payer: Medicare Other | Admitting: Cardiovascular Disease

## 2012-11-28 ENCOUNTER — Encounter: Payer: Self-pay | Admitting: Cardiovascular Disease

## 2012-11-28 VITALS — BP 104/57 | HR 69 | Ht 64.0 in | Wt 134.0 lb

## 2012-11-28 DIAGNOSIS — I2510 Atherosclerotic heart disease of native coronary artery without angina pectoris: Secondary | ICD-10-CM

## 2012-11-28 NOTE — Progress Notes (Signed)
History of Present Illness: 79 yo WF with history of CAD, HTN, HLD here today for cardiac follow up. She has been followed in the past by Dr. Deborah Chalk. She had an anterolateral MI in 2001 with stent placement in the Diagonal branch of the LAD. Last cath was in 2007. Last stress test was December 2009 with LVEF of 47%, anterior akinesis, anterior scar and no ischemia.   She is here for f/u. She has been feeling well. She has had no chest pain or SOB.   Primary Care Physician: Earl Gala  Last Lipid Profile:  Lipid Panel     Component Value Date/Time   CHOL 151 09/19/2011 0846   TRIG 151.0* 09/19/2011 0846   HDL 53.90 09/19/2011 0846   CHOLHDL 3 09/19/2011 0846   VLDL 30.2 09/19/2011 0846   LDLCALC 67 09/19/2011 0846     Past Medical History  Diagnosis Date  . Coronary artery disease     had diffuse three-vessel coronary disease without focal stenosis at that time -- Last cath was in 2007showed anterior wall hypokinesis with well-preserved apical and inferior wall motion with and EF of approximately 45% -- last cardiolite study 12/09 this showed an EF of 47% with anterior akinesis, anterior scar , & no ischemia general  . Hypercholesterolemia   . History of shingles   . History of acute anterior wall myocardial infarction 04/25/1999    with a stent placement in her first diagonal vessel, and subsequent cutting balloon to the diagonal in 2001  . Hypertension   . Fatigue   . Weakness     Past Surgical History  Procedure Laterality Date  . Cholecystectomy    . Breast biopsy      Bilateral  . Abdominal hysterectomy      Current Outpatient Prescriptions  Medication Sig Dispense Refill  . aspirin 81 MG tablet Take 81 mg by mouth daily.        . calcium carbonate (OS-CAL) 600 MG TABS Take 600 mg by mouth daily.        . Cholecalciferol (VITAMIN D3) 1000 UNITS CAPS Take 1,000 mg by mouth daily.        Marland Kitchen estrogens, conjugated, (PREMARIN) 0.3 MG tablet Take 0.3 mg by mouth every 3 (three) days.  Take daily for 21 days then do not take for 7 days.       . isosorbide mononitrate (IMDUR) 30 MG 24 hr tablet Take 0.5 tablets (15 mg total) by mouth daily.  15 tablet  4  . losartan-hydrochlorothiazide (HYZAAR) 100-12.5 MG per tablet TAKE 1 TABLET BY MOUTH EVERY DAY  30 tablet  11  . metoprolol succinate (TOPROL-XL) 50 MG 24 hr tablet Take 1 tablet (50 mg total) by mouth daily. Take with or immediately following a meal.  30 tablet  4  . nitroGLYCERIN (NITROSTAT) 0.4 MG SL tablet Place 1 tablet (0.4 mg total) under the tongue every 5 (five) minutes as needed for chest pain.  25 tablet  6  . simvastatin (ZOCOR) 40 MG tablet TAKE 1/2 TABLET BY MOUTH AT BEDTIME  30 tablet  5   No current facility-administered medications for this visit.    Allergies  Allergen Reactions  . Alendronate Sodium     REACTION: Reaction not known  . Risedronate Sodium     REACTION: Reaction not known    History   Social History  . Marital Status: Married    Spouse Name: N/A    Number of Children: 3  . Years  of Education: N/A   Occupational History  . Retired-[employer]    Social History Main Topics  . Smoking status: Never Smoker   . Smokeless tobacco: Not on file  . Alcohol Use: No  . Drug Use: No  . Sexual Activity: Not on file   Other Topics Concern  . Not on file   Social History Narrative  . No narrative on file    Family History  Problem Relation Age of Onset  . Heart disease Neg Hx     Review of Systems:  As stated in the HPI and otherwise negative.   BP 104/57  Pulse 69  Ht 5\' 4"  (1.626 m)  Wt 134 lb (60.782 kg)  BMI 22.99 kg/m2  Physical Examination: General: Well developed, well nourished, NAD HEENT: OP clear, mucus membranes moist SKIN: warm, dry. No rashes. Neuro: No focal deficits Musculoskeletal: Muscle strength 5/5 all ext Psychiatric: Mood and affect normal Neck: No JVD, no carotid bruits, no thyromegaly, no lymphadenopathy. Lungs:Clear bilaterally, no  wheezes, rhonci, crackles Cardiovascular: Regular rate and rhythm. No murmurs, gallops or rubs. Abdomen:Soft. Bowel sounds present. Non-tender.  Extremities: No lower extremity edema. Pulses are 2 + in the bilateral DP/PT.  Assessment and Plan:   1. CAD: Stable. No chest pain. BP and lipids are well controlled. No changes today. It has been 5 years since her last ischemic evaluation. Will arrange exercise stress myoview. Will check lipids and LFTs.

## 2012-11-28 NOTE — Patient Instructions (Signed)
Your physician wants you to follow-up in:  12 months.  You will receive a reminder letter in the mail two months in advance. If you don't receive a letter, please call our office to schedule the follow-up appointment.  Your physician has requested that you have an exercise stress myoview. For further information please visit [email or web address] Please follow instruction sheet, as given.  Your physician recommends that you return for fasting lab work on day of stress test--Lipid and Liver profiles

## 2012-12-11 ENCOUNTER — Other Ambulatory Visit (INDEPENDENT_AMBULATORY_CARE_PROVIDER_SITE_OTHER): Payer: Medicare Other

## 2012-12-11 ENCOUNTER — Ambulatory Visit (HOSPITAL_COMMUNITY): Payer: Medicare Other | Attending: Cardiovascular Disease | Admitting: Radiology

## 2012-12-11 VITALS — BP 153/67 | Ht 64.0 in | Wt 132.0 lb

## 2012-12-11 DIAGNOSIS — R002 Palpitations: Secondary | ICD-10-CM | POA: Diagnosis not present

## 2012-12-11 DIAGNOSIS — R079 Chest pain, unspecified: Secondary | ICD-10-CM | POA: Diagnosis not present

## 2012-12-11 DIAGNOSIS — R0602 Shortness of breath: Secondary | ICD-10-CM

## 2012-12-11 DIAGNOSIS — I2510 Atherosclerotic heart disease of native coronary artery without angina pectoris: Secondary | ICD-10-CM | POA: Diagnosis not present

## 2012-12-11 DIAGNOSIS — I10 Essential (primary) hypertension: Secondary | ICD-10-CM | POA: Diagnosis not present

## 2012-12-11 DIAGNOSIS — R0989 Other specified symptoms and signs involving the circulatory and respiratory systems: Secondary | ICD-10-CM | POA: Diagnosis not present

## 2012-12-11 DIAGNOSIS — R0609 Other forms of dyspnea: Secondary | ICD-10-CM | POA: Diagnosis not present

## 2012-12-11 DIAGNOSIS — R5381 Other malaise: Secondary | ICD-10-CM | POA: Insufficient documentation

## 2012-12-11 DIAGNOSIS — R5383 Other fatigue: Secondary | ICD-10-CM | POA: Diagnosis not present

## 2012-12-11 LAB — LIPID PANEL
Cholesterol: 153 mg/dL (ref 0–200)
HDL: 49.1 mg/dL (ref >39.00)
LDL Cholesterol: 77 mg/dL (ref 0–99)
Total CHOL/HDL Ratio: 3
Triglycerides: 133 mg/dL (ref 0.0–149.0)
VLDL: 26.6 mg/dL (ref 0.0–40.0)

## 2012-12-11 LAB — HEPATIC FUNCTION PANEL
ALT: 14 U/L (ref 0–35)
AST: 30 U/L (ref 0–37)
Albumin: 3.8 g/dL (ref 3.5–5.2)
Alkaline Phosphatase: 53 U/L (ref 39–117)
Bilirubin, Direct: 0.3 mg/dL (ref 0.0–0.3)
Total Bilirubin: 1.4 mg/dL — ABNORMAL HIGH (ref 0.3–1.2)
Total Protein: 6.7 g/dL (ref 6.0–8.3)

## 2012-12-11 MED ORDER — TECHNETIUM TC 99M SESTAMIBI GENERIC - CARDIOLITE
11.0000 | Freq: Once | INTRAVENOUS | Status: AC | PRN
  Administered 2012-12-11: 11 via INTRAVENOUS

## 2012-12-11 MED ORDER — TECHNETIUM TC 99M SESTAMIBI GENERIC - CARDIOLITE
33.0000 | Freq: Once | INTRAVENOUS | Status: AC | PRN
  Administered 2012-12-11: 33 via INTRAVENOUS

## 2012-12-11 NOTE — Progress Notes (Signed)
[name] SITE [address] NUCLEAR MED [address] [place], [place] [address] [phone]    Cardiology Nuclear Med Study  [name] is a 79 y.o. female     MRN : [id number]     DOB: 13-Mar-1933  Procedure Date: 12/11/2012  Nuclear Med Background Indication for Stress Test:  Evaluation for Ischemia and Stent Patency History:  '01 MI-Stents: LAD '07 Heart Cath: mod 3V Dz EF: 40-45% '12 MPS: NL EF: 47% Cardiac Risk Factors: Hypertension and Lipids  Symptoms:  DOE, Fatigue, Palpitations, Rapid HR and SOB   Nuclear Pre-Procedure Caffeine/Decaff Intake:  None NPO After: 6 pm   Lungs:  clear O2 Sat: 97% on room air. IV 0.9% NS with Angio Cath:  22g  IV Site: L Antecubital  IV Started by:  Bonnita Levan, RN  Chest Size (in):  36 Cup Size: B  Height: 5\' 4"  (1.626 m)  Weight:  132 lb (59.875 kg)  BMI:  Body mass index is 22.65 kg/(m^2). Tech Comments:  Toprol held x 24 hrs. Dr. Clifton James consulted and he advised that the patient could go home.    Nuclear Med Study 1 or 2 day study: 1 day  Stress Test Type:  Stress  Reading MD: Charlton Haws, MD  Order Authorizing Provider:  Verne Carrow, MD  Resting Radionuclide: Technetium 54m Sestamibi  Resting Radionuclide Dose: 11.0 mCi   Stress Radionuclide:  Technetium 31m Sestamibi  Stress Radionuclide Dose: 33.0 mCi           Stress Protocol Rest HR: 58 Stress HR: 127  Rest BP: 153/67 Stress BP: 170/79  Exercise Time (min): 4:31 METS: 6.40   Predicted Max HR: 142 bpm % Max HR: 89.44 bpm Rate Pressure Product: 69629   Dose of Adenosine (mg):  n/a Dose of Lexiscan: n/a mg  Dose of Atropine (mg): n/a Dose of Dobutamine: n/a mcg/kg/min (at max HR)  Stress Test Technologist: Milana Na, EMT-P  Nuclear Technologist:  Domenic Polite, CNMT     Rest Procedure:  Myocardial perfusion imaging was performed at rest 45 minutes following the intravenous administration of Technetium 23m Sestamibi. Rest ECG: NSR PVC;s Poor R  wave progression.  inferolateral T wave changes  Stress Procedure:  The patient exercised on the treadmill utilizing the Bruce Protocol for 4:31 minutes. The patient stopped due to extreme fatigue, sob, and denied any chest pain.  Technetium 5m Sestamibi was injected at peak exercise and myocardial perfusion imaging was performed after a brief delay. Stress ECG: Further inferolateral ST segment depression and T wave inversion in recovery  QPS Raw Data Images:  Normal; no motion artifact; normal heart/lung ratio. Stress Images:  There is decreased uptake in the anterior wall. Rest Images:  There is decreased uptake in the anterior wall. Subtraction (SDS):  There is a fixed defect that is most consistent with a previous infarction. Transient Ischemic Dilatation (Normal <1.22):  n/a Lung/Heart Ratio (Normal <0.45):  0.35  Quantitative Gated Spect Images QGS EDV:  n/a QGS ESV:  n/a  Impression Exercise Capacity:  Fair exercise capacity. BP Response:  Normal blood pressure response. Clinical Symptoms:  There is dyspnea. ECG Impression:  Baseline ECG changes make stress ECG non diagnostic Comparison with Prior Nuclear Study: No images to compare  Overall Impression:  Intermediate risk stress nuclear study Large anterior wall infarct from apex to base with no ischemia.  LV Ejection Fraction: Study not gated.  LV Wall Motion:  Study not gated   [employer]

## 2012-12-12 DIAGNOSIS — Z23 Encounter for immunization: Secondary | ICD-10-CM | POA: Diagnosis not present

## 2012-12-17 ENCOUNTER — Encounter: Payer: Self-pay | Admitting: Cardiovascular Disease

## 2012-12-17 ENCOUNTER — Ambulatory Visit (INDEPENDENT_AMBULATORY_CARE_PROVIDER_SITE_OTHER): Payer: Medicare Other | Admitting: Cardiovascular Disease

## 2012-12-17 ENCOUNTER — Other Ambulatory Visit: Payer: Self-pay | Admitting: *Deleted

## 2012-12-17 ENCOUNTER — Encounter: Payer: Self-pay | Admitting: *Deleted

## 2012-12-17 ENCOUNTER — Encounter (HOSPITAL_COMMUNITY): Payer: Self-pay | Admitting: Pharmacy Technician

## 2012-12-17 VITALS — BP 150/80 | HR 69 | Wt 134.0 lb

## 2012-12-17 DIAGNOSIS — I2510 Atherosclerotic heart disease of native coronary artery without angina pectoris: Secondary | ICD-10-CM | POA: Diagnosis not present

## 2012-12-17 DIAGNOSIS — R9439 Abnormal result of other cardiovascular function study: Secondary | ICD-10-CM | POA: Diagnosis not present

## 2012-12-17 LAB — PROTIME-INR
INR: 1 ratio (ref 0.8–1.0)
Prothrombin Time: 10.8 s (ref 10.2–12.4)

## 2012-12-17 LAB — CBC WITH DIFFERENTIAL/PLATELET
Basophils Absolute: 0 10*3/uL (ref 0.0–0.1)
Basophils Relative: 0.6 % (ref 0.0–3.0)
Eosinophils Absolute: 0.3 10*3/uL (ref 0.0–0.7)
Eosinophils Relative: 3.2 % (ref 0.0–5.0)
HCT: 38.8 % (ref 36.0–46.0)
Hemoglobin: 13.2 g/dL (ref 12.0–15.0)
Lymphocytes Relative: 30.9 % (ref 12.0–46.0)
Lymphs Abs: 2.5 10*3/uL (ref 0.7–4.0)
MCHC: 34 g/dL (ref 30.0–36.0)
MCV: 91.1 fl (ref 78.0–100.0)
Monocytes Absolute: 0.7 10*3/uL (ref 0.1–1.0)
Monocytes Relative: 8.8 % (ref 3.0–12.0)
Neutro Abs: 4.5 10*3/uL (ref 1.4–7.7)
Neutrophils Relative %: 56.5 % (ref 43.0–77.0)
Platelets: 248 10*3/uL (ref 150.0–400.0)
RBC: 4.25 Mil/uL (ref 3.87–5.11)
RDW: 13.4 % (ref 11.5–14.6)
WBC: 8.1 10*3/uL (ref 4.5–10.5)

## 2012-12-17 LAB — BASIC METABOLIC PANEL
BUN: 18 mg/dL (ref 6–23)
CO2: 26 mEq/L (ref 19–32)
Calcium: 9.3 mg/dL (ref 8.4–10.5)
Chloride: 106 mEq/L (ref 96–112)
Creatinine, Ser: 1 mg/dL (ref 0.4–1.2)
GFR: 58.33 mL/min — ABNORMAL LOW (ref >60.00)
Glucose, Bld: 91 mg/dL (ref 70–99)
Potassium: 3.3 mEq/L — ABNORMAL LOW (ref 3.5–5.1)
Sodium: 139 mEq/L (ref 135–145)

## 2012-12-17 MED ORDER — POTASSIUM CHLORIDE CRYS ER 20 MEQ PO TBCR: EXTENDED_RELEASE_TABLET | ORAL | Status: DC

## 2012-12-17 NOTE — Patient Instructions (Addendum)
Your physician recommends that you schedule a follow-up appointment in:  About 6 weeks.   Your physician has requested that you have a cardiac catheterization. Cardiac catheterization is used to diagnose and/or treat various heart conditions. Doctors may recommend this procedure for a number of different reasons. The most common reason is to evaluate chest pain. Chest pain can be a symptom of coronary artery disease (CAD), and cardiac catheterization can show whether plaque is narrowing or blocking your heart's arteries. This procedure is also used to evaluate the valves, as well as measure the blood flow and oxygen levels in different parts of your heart. For further information please visit [email or web address] Please follow instruction sheet, as given. Scheduled for December 18, 2012.   Coronary Angiography Coronary angiography is an X-ray procedure used to look at the arteries in the heart. In this procedure, a dye is injected through a long, hollow tube (catheter). The catheter is about the size of a piece of cooked spaghetti. The catheter injects a dye into an artery in your groin. X-rays are then taken to show if there is a blockage in the arteries of your heart. BEFORE THE PROCEDURE   Let your caregiver know if you have allergies to shellfish or contrast dye. Also let your caregiver know if you have kidney problems or failure.  Do not eat or drink starting from midnight up to the time of the procedure, or as directed.  You may drink enough water to take your medications the morning of the procedure if you were instructed to do so.  You should be at the hospital or outpatient facility where the procedure is to be done 60 minutes prior to the procedure or as directed. PROCEDURE  You may be given an IV medication to help you relax before the procedure.  You will be prepared for the procedure by washing and shaving the area where the catheter will be inserted. This is usually done in the groin  but may be done in the fold of your arm by your elbow.  A medicine will be given to numb your groin where the catheter will be inserted.  A specially trained doctor will insert the catheter into an artery in your groin. The catheter is guided by using a special type of X-ray (fluoroscopy) to the blood vessel being examined.  A special dye is then injected into the catheter and X-rays are taken. The dye helps to show where any narrowing or blockages are located in the heart arteries. AFTER THE PROCEDURE   After the procedure you will be kept in bed lying flat for several hours. You will be instructed to not bend or cross your legs.  The groin insertion site will be watched and checked frequently.  The pulse in your feet will be checked frequently.  Additional blood tests, X-rays and an EKG may be done.  You may stay in the hospital overnight for observation. SEEK IMMEDIATE MEDICAL CARE IF:   You develop chest pain, shortness of breath, feel faint, or pass out.  There is bleeding, swelling, or drainage from the catheter insertion site.  You develop pain, discoloration, coldness, or severe bruising in the leg or area where the catheter was inserted.  You have a fever. Document Released: 09/10/2002 Document Revised: 05/29/2011 Document Reviewed: 10/30/2007 [facility] Patient Information 2014 [place], [place].

## 2012-12-17 NOTE — Progress Notes (Signed)
History of Present Illness: 79 yo WF with history of CAD, HTN, HLD here today for cardiac follow up. She has been followed in the past by Dr. Deborah Chalk. She had an anterolateral MI in 2001 with stent placement in the Diagonal branch of the LAD. Last cath was in 2007. I saw her on 11/28/12 and arranged a stress myoview since she had no recent ischemic testing. She had no specific complaints at that time. Stress myoview 12/11/12 with large scar in the anterior wall, LVEF not calculated. She exercised for 4 minutes and had fatigue and SOB.   She is here for f/u. She tells me that her energy is down. Dyspnea with minimal exertion. No chest pain.   Primary Care Physician: Earl Gala  Last Lipid Profile:  Lipid Panel     Component Value Date/Time   CHOL 153 12/11/2012 0804   TRIG 133.0 12/11/2012 0804   HDL 49.10 12/11/2012 0804   CHOLHDL 3 12/11/2012 0804   VLDL 26.6 12/11/2012 0804   LDLCALC 77 12/11/2012 0804     Past Medical History  Diagnosis Date  . Coronary artery disease     had diffuse three-vessel coronary disease without focal stenosis at that time -- Last cath was in 2007showed anterior wall hypokinesis with well-preserved apical and inferior wall motion with and EF of approximately 45% -- last cardiolite study 12/09 this showed an EF of 47% with anterior akinesis, anterior scar , & no ischemia general  . Hypercholesterolemia   . History of shingles   . History of acute anterior wall myocardial infarction 04/25/1999    with a stent placement in her first diagonal vessel, and subsequent cutting balloon to the diagonal in 2001  . Hypertension   . Fatigue   . Weakness     Past Surgical History  Procedure Laterality Date  . Cholecystectomy    . Breast biopsy      Bilateral  . Abdominal hysterectomy      Current Outpatient Prescriptions  Medication Sig Dispense Refill  . aspirin 81 MG tablet Take 81 mg by mouth daily.        . calcium carbonate (OS-CAL) 600 MG TABS Take 600 mg by  mouth daily.        . Cholecalciferol (VITAMIN D3) 1000 UNITS CAPS Take 1,000 mg by mouth daily.        Marland Kitchen estrogens, conjugated, (PREMARIN) 0.3 MG tablet Take 0.3 mg by mouth every 3 (three) days.       . isosorbide mononitrate (IMDUR) 30 MG 24 hr tablet Take 0.5 tablets (15 mg total) by mouth daily.  15 tablet  4  . losartan-hydrochlorothiazide (HYZAAR) 100-12.5 MG per tablet TAKE 1 TABLET BY MOUTH EVERY DAY  30 tablet  11  . metoprolol succinate (TOPROL-XL) 50 MG 24 hr tablet Take 1 tablet (50 mg total) by mouth daily. Take with or immediately following a meal.  30 tablet  4  . nitroGLYCERIN (NITROSTAT) 0.4 MG SL tablet Place 1 tablet (0.4 mg total) under the tongue every 5 (five) minutes as needed for chest pain.  25 tablet  6  . simvastatin (ZOCOR) 40 MG tablet TAKE 1/2 TABLET BY MOUTH AT BEDTIME  30 tablet  5   No current facility-administered medications for this visit.    Allergies  Allergen Reactions  . Alendronate Sodium     REACTION: Reaction not known  . Risedronate Sodium     REACTION: Reaction not known    History   Social History  .  Marital Status: Married    Spouse Name: N/A    Number of Children: 3  . Years of Education: N/A   Occupational History  . Retired-[employer]    Social History Main Topics  . Smoking status: Never Smoker   . Smokeless tobacco: Not on file  . Alcohol Use: No  . Drug Use: No  . Sexual Activity: Not on file   Other Topics Concern  . Not on file   Social History Narrative  . No narrative on file    Family History  Problem Relation Age of Onset  . Heart disease Neg Hx     Review of Systems:  As stated in the HPI and otherwise negative.   BP 150/80  Pulse 69  Wt 134 lb (60.782 kg)  BMI 22.99 kg/m2  Physical Examination: General: Well developed, well nourished, NAD HEENT: OP clear, mucus membranes moist SKIN: warm, dry. No rashes. Neuro: No focal deficits Musculoskeletal: Muscle strength 5/5 all ext Psychiatric:  Mood and affect normal Neck: No JVD, no carotid bruits, no thyromegaly, no lymphadenopathy. Lungs:Clear bilaterally, no wheezes, rhonci, crackles Cardiovascular: Regular rate and rhythm. No murmurs, gallops or rubs. Abdomen:Soft. Bowel sounds present. Non-tender.  Extremities: No lower extremity edema. Pulses are 2 + in the bilateral DP/PT.  Stress myoview 12/11/12: Stress Procedure: The patient exercised on the treadmill utilizing the Bruce Protocol for 4:31 minutes. The patient stopped due to extreme fatigue, sob, and denied any chest pain. Technetium 40m Sestamibi was injected at peak exercise and myocardial perfusion imaging was performed after a brief delay.  Stress ECG: Further inferolateral ST segment depression and T wave inversion in recovery  QPS  Raw Data Images: Normal; no motion artifact; normal heart/lung ratio.  Stress Images: There is decreased uptake in the anterior wall.  Rest Images: There is decreased uptake in the anterior wall.  Subtraction (SDS): There is a fixed defect that is most consistent with a previous infarction.  Transient Ischemic Dilatation (Normal <1.22): n/a  Lung/Heart Ratio (Normal <0.45): 0.35  Quantitative Gated Spect Images  QGS EDV: n/a  QGS ESV: n/a  Impression  Exercise Capacity: Fair exercise capacity.  BP Response: Normal blood pressure response.  Clinical Symptoms: There is dyspnea.  ECG Impression: Baseline ECG changes make stress ECG non diagnostic  Comparison with Prior Nuclear Study: No images to compare  Overall Impression: Intermediate risk stress nuclear study Large anterior wall infarct from apex to base with no ischemia.  LV Ejection Fraction: Study not gated. LV Wall Motion: Study not gated  EKG: Sinus, rate 69 bpm. PVC. Old septal infarct.   Assessment and Plan:   1. CAD: Recent dyspnea, fatigue. Abnormal stress myoview. Will arrange cardiac cath with possible PCI tomorrow at [facility]. Risks and benefits reviewed with pt. She  agrees to proceed. Pre-cath labs today. BP and lipids are well controlled.  2. Abnormal stress test: As above

## 2012-12-18 ENCOUNTER — Encounter (HOSPITAL_COMMUNITY)
Admission: RE | Disposition: A | Discharge status: Home / Self Care | Payer: Self-pay | Source: Ambulatory Visit | Attending: Cardiovascular Disease

## 2012-12-18 ENCOUNTER — Ambulatory Visit (HOSPITAL_COMMUNITY)
Admission: RE | Admit: 2012-12-18 | Discharge: 2012-12-18 | Disposition: A | Discharge status: Home / Self Care | Payer: Medicare Other | Source: Ambulatory Visit | Attending: Cardiovascular Disease | Admitting: Cardiovascular Disease

## 2012-12-18 DIAGNOSIS — E785 Hyperlipidemia, unspecified: Secondary | ICD-10-CM | POA: Diagnosis not present

## 2012-12-18 DIAGNOSIS — I252 Old myocardial infarction: Secondary | ICD-10-CM | POA: Insufficient documentation

## 2012-12-18 DIAGNOSIS — R0602 Shortness of breath: Secondary | ICD-10-CM | POA: Diagnosis not present

## 2012-12-18 DIAGNOSIS — Z79899 Other long term (current) drug therapy: Secondary | ICD-10-CM | POA: Diagnosis not present

## 2012-12-18 DIAGNOSIS — I519 Heart disease, unspecified: Secondary | ICD-10-CM | POA: Diagnosis not present

## 2012-12-18 DIAGNOSIS — I10 Essential (primary) hypertension: Secondary | ICD-10-CM | POA: Insufficient documentation

## 2012-12-18 DIAGNOSIS — Z9861 Coronary angioplasty status: Secondary | ICD-10-CM | POA: Diagnosis not present

## 2012-12-18 DIAGNOSIS — I2510 Atherosclerotic heart disease of native coronary artery without angina pectoris: Secondary | ICD-10-CM

## 2012-12-18 DIAGNOSIS — R9439 Abnormal result of other cardiovascular function study: Secondary | ICD-10-CM | POA: Insufficient documentation

## 2012-12-18 HISTORY — PX: LEFT HEART CATHETERIZATION WITH CORONARY ANGIOGRAM: SHX5451

## 2012-12-18 LAB — POTASSIUM: Potassium: 4.2 mEq/L (ref 3.5–5.1)

## 2012-12-18 SURGERY — LEFT HEART CATHETERIZATION WITH CORONARY ANGIOGRAM
Anesthesia: LOCAL

## 2012-12-18 MED ORDER — ONDANSETRON HCL 4 MG/2ML IJ SOLN: 4.0000 mg | Freq: Four times a day (QID) | INTRAMUSCULAR | Status: DC | PRN

## 2012-12-18 MED ORDER — ACETAMINOPHEN 325 MG PO TABS: 650.0000 mg | ORAL_TABLET | ORAL | Status: DC | PRN

## 2012-12-18 MED ORDER — SODIUM CHLORIDE 0.9 % IV SOLN
INTRAVENOUS | Status: DC
  Administered 2012-12-18: 07:00:00 via INTRAVENOUS

## 2012-12-18 MED ORDER — SODIUM CHLORIDE 0.9 % IJ SOLN: 3.0000 mL | Freq: Two times a day (BID) | INTRAMUSCULAR | Status: DC

## 2012-12-18 MED ORDER — SODIUM CHLORIDE 0.9 % IV SOLN
INTRAVENOUS | Status: AC
  Administered 2012-12-18: 10:00:00 via INTRAVENOUS

## 2012-12-18 MED ORDER — HEPARIN SODIUM (PORCINE) 1000 UNIT/ML IJ SOLN
INTRAMUSCULAR | Status: AC
  Filled 2012-12-18: qty 1

## 2012-12-18 MED ORDER — HEPARIN (PORCINE) IN NACL 2-0.9 UNIT/ML-% IJ SOLN
INTRAMUSCULAR | Status: AC
  Filled 2012-12-18: qty 1000

## 2012-12-18 MED ORDER — VERAPAMIL HCL 2.5 MG/ML IV SOLN
INTRAVENOUS | Status: AC
  Filled 2012-12-18: qty 2

## 2012-12-18 MED ORDER — DIAZEPAM 5 MG PO TABS
ORAL_TABLET | ORAL | Status: AC
  Filled 2012-12-18: qty 1

## 2012-12-18 MED ORDER — FENTANYL CITRATE 0.05 MG/ML IJ SOLN
INTRAMUSCULAR | Status: AC
  Filled 2012-12-18: qty 2

## 2012-12-18 MED ORDER — SODIUM CHLORIDE 0.9 % IV SOLN: 250.0000 mL | INTRAVENOUS | Status: DC | PRN

## 2012-12-18 MED ORDER — ASPIRIN 81 MG PO CHEW
81.0000 mg | CHEWABLE_TABLET | ORAL | Status: AC
  Administered 2012-12-18: 324 mg via ORAL
  Filled 2012-12-18: qty 4

## 2012-12-18 MED ORDER — LIDOCAINE HCL (PF) 1 % IJ SOLN
INTRAMUSCULAR | Status: AC
  Filled 2012-12-18: qty 30

## 2012-12-18 MED ORDER — DIAZEPAM 5 MG PO TABS
5.0000 mg | ORAL_TABLET | ORAL | Status: AC
  Administered 2012-12-18: 5 mg via ORAL

## 2012-12-18 MED ORDER — SODIUM CHLORIDE 0.9 % IJ SOLN: 3.0000 mL | INTRAMUSCULAR | Status: DC | PRN

## 2012-12-18 MED ORDER — MIDAZOLAM HCL 2 MG/2ML IJ SOLN
INTRAMUSCULAR | Status: AC
  Filled 2012-12-18: qty 2

## 2012-12-18 NOTE — Interval H&P Note (Signed)
History and Physical Interval Note:  12/18/2012 8:36 AM  [name]  has presented today for cardiac cath with the diagnosis of dyspnea, CAD, abnormal stress myoview.  The various methods of treatment have been discussed with the patient and family. After consideration of risks, benefits and other options for treatment, the patient has consented to  Procedure(s): LEFT HEART CATHETERIZATION WITH CORONARY ANGIOGRAM (N/A) as a surgical intervention .  The patient's history has been reviewed, patient examined, no change in status, stable for surgery.  I have reviewed the patient's chart and labs.  Questions were answered to the patient's satisfaction.    Cath Lab Visit (complete for each Cath Lab visit)  Clinical Evaluation Leading to the Procedure:   ACS: no  Non-ACS:    Anginal Classification: CCS II  Anti-ischemic medical therapy: Maximal Therapy (2 or more classes of medications)  Non-Invasive Test Results: Intermediate-risk stress test findings: cardiac mortality 1-3%/year  Prior CABG: No previous CABG        [name]

## 2012-12-18 NOTE — H&P (View-Only) (Signed)
 History of Present Illness: 79 yo WF with history of CAD, HTN, HLD here today for cardiac follow up. She has been followed in the past by Dr. Tennant. She had an anterolateral MI in 2001 with stent placement in the Diagonal branch of the LAD. Last cath was in 2007. I saw her on 11/28/12 and arranged a stress myoview since she had no recent ischemic testing. She had no specific complaints at that time. Stress myoview 12/11/12 with large scar in the anterior wall, LVEF not calculated. She exercised for 4 minutes and had fatigue and SOB.   She is here for f/u. She tells me that her energy is down. Dyspnea with minimal exertion. No chest pain.   Primary Care Physician: Osborne  Last Lipid Profile:  Lipid Panel     Component Value Date/Time   CHOL 153 12/11/2012 0804   TRIG 133.0 12/11/2012 0804   HDL 49.10 12/11/2012 0804   CHOLHDL 3 12/11/2012 0804   VLDL 26.6 12/11/2012 0804   LDLCALC 77 12/11/2012 0804     Past Medical History  Diagnosis Date  . Coronary artery disease     had diffuse three-vessel coronary disease without focal stenosis at that time -- Last cath was in 2007showed anterior wall hypokinesis with well-preserved apical and inferior wall motion with and EF of approximately 45% -- last cardiolite study 12/09 this showed an EF of 47% with anterior akinesis, anterior scar , & no ischemia general  . Hypercholesterolemia   . History of shingles   . History of acute anterior wall myocardial infarction 04/25/1999    with a stent placement in her first diagonal vessel, and subsequent cutting balloon to the diagonal in 2001  . Hypertension   . Fatigue   . Weakness     Past Surgical History  Procedure Laterality Date  . Cholecystectomy    . Breast biopsy      Bilateral  . Abdominal hysterectomy      Current Outpatient Prescriptions  Medication Sig Dispense Refill  . aspirin 81 MG tablet Take 81 mg by mouth daily.        . calcium carbonate (OS-CAL) 600 MG TABS Take 600 mg by  mouth daily.        . Cholecalciferol (VITAMIN D3) 1000 UNITS CAPS Take 1,000 mg by mouth daily.        . estrogens, conjugated, (PREMARIN) 0.3 MG tablet Take 0.3 mg by mouth every 3 (three) days.       . isosorbide mononitrate (IMDUR) 30 MG 24 hr tablet Take 0.5 tablets (15 mg total) by mouth daily.  15 tablet  4  . losartan-hydrochlorothiazide (HYZAAR) 100-12.5 MG per tablet TAKE 1 TABLET BY MOUTH EVERY DAY  30 tablet  11  . metoprolol succinate (TOPROL-XL) 50 MG 24 hr tablet Take 1 tablet (50 mg total) by mouth daily. Take with or immediately following a meal.  30 tablet  4  . nitroGLYCERIN (NITROSTAT) 0.4 MG SL tablet Place 1 tablet (0.4 mg total) under the tongue every 5 (five) minutes as needed for chest pain.  25 tablet  6  . simvastatin (ZOCOR) 40 MG tablet TAKE 1/2 TABLET BY MOUTH AT BEDTIME  30 tablet  5   No current facility-administered medications for this visit.    Allergies  Allergen Reactions  . Alendronate Sodium     REACTION: Reaction not known  . Risedronate Sodium     REACTION: Reaction not known    History   Social History  .   Marital Status: Married    Spouse Name: N/A    Number of Children: 3  . Years of Education: N/A   Occupational History  . Retired-[employer]    Social History Main Topics  . Smoking status: Never Smoker   . Smokeless tobacco: Not on file  . Alcohol Use: No  . Drug Use: No  . Sexual Activity: Not on file   Other Topics Concern  . Not on file   Social History Narrative  . No narrative on file    Family History  Problem Relation Age of Onset  . Heart disease Neg Hx     Review of Systems:  As stated in the HPI and otherwise negative.   BP 150/80  Pulse 69  Wt 134 lb (60.782 kg)  BMI 22.99 kg/m2  Physical Examination: General: Well developed, well nourished, NAD HEENT: OP clear, mucus membranes moist SKIN: warm, dry. No rashes. Neuro: No focal deficits Musculoskeletal: Muscle strength 5/5 all ext Psychiatric:  Mood and affect normal Neck: No JVD, no carotid bruits, no thyromegaly, no lymphadenopathy. Lungs:Clear bilaterally, no wheezes, rhonci, crackles Cardiovascular: Regular rate and rhythm. No murmurs, gallops or rubs. Abdomen:Soft. Bowel sounds present. Non-tender.  Extremities: No lower extremity edema. Pulses are 2 + in the bilateral DP/PT.  Stress myoview 12/11/12: Stress Procedure: The patient exercised on the treadmill utilizing the Bruce Protocol for 4:31 minutes. The patient stopped due to extreme fatigue, sob, and denied any chest pain. Technetium 99m Sestamibi was injected at peak exercise and myocardial perfusion imaging was performed after a brief delay.  Stress ECG: Further inferolateral ST segment depression and T wave inversion in recovery  QPS  Raw Data Images: Normal; no motion artifact; normal heart/lung ratio.  Stress Images: There is decreased uptake in the anterior wall.  Rest Images: There is decreased uptake in the anterior wall.  Subtraction (SDS): There is a fixed defect that is most consistent with a previous infarction.  Transient Ischemic Dilatation (Normal <1.22): n/a  Lung/Heart Ratio (Normal <0.45): 0.35  Quantitative Gated Spect Images  QGS EDV: n/a  QGS ESV: n/a  Impression  Exercise Capacity: Fair exercise capacity.  BP Response: Normal blood pressure response.  Clinical Symptoms: There is dyspnea.  ECG Impression: Baseline ECG changes make stress ECG non diagnostic  Comparison with Prior Nuclear Study: No images to compare  Overall Impression: Intermediate risk stress nuclear study Large anterior wall infarct from apex to base with no ischemia.  LV Ejection Fraction: Study not gated. LV Wall Motion: Study not gated  EKG: Sinus, rate 69 bpm. PVC. Old septal infarct.   Assessment and Plan:   1. CAD: Recent dyspnea, fatigue. Abnormal stress myoview. Will arrange cardiac cath with possible PCI tomorrow at [facility]. Risks and benefits reviewed with pt. She  agrees to proceed. Pre-cath labs today. BP and lipids are well controlled.  2. Abnormal stress test: As above 

## 2012-12-18 NOTE — Interval H&P Note (Signed)
History and Physical Interval Note:  12/18/2012 8:36 AM  [name]  has presented today for cardiac cath with the diagnosis of dyspnea, CAD, abnormal stress myoview.  The various methods of treatment have been discussed with the patient and family. After consideration of risks, benefits and other options for treatment, the patient has consented to  Procedure(s): LEFT HEART CATHETERIZATION WITH CORONARY ANGIOGRAM (N/A) as a surgical intervention .  The patient's history has been reviewed, patient examined, no change in status, stable for surgery.  I have reviewed the patient's chart and labs.  Questions were answered to the patient's satisfaction.     [name]

## 2012-12-18 NOTE — CV Procedure (Signed)
    Cardiac Catheterization Operative Report  [name] [id number] 10/1/20149:22 AM Darnelle Bos, MD  Procedure Performed:  1. Left Heart Catheterization 2. Selective Coronary Angiography 3. Left ventricular angiogram  Operator: Verne Carrow, MD  Arterial access site:  Right radial artery.   Indication: 79 yo female with history of CAD s/p bare metal stent 2001 in Diagonal, HTN, HLD with recent dyspnea. Stress myoview with large anterior perfusion defect LVEF=43%. Cardiac cath to exclude progression of CAD.                                       Procedure Details: The risks, benefits, complications, treatment options, and expected outcomes were discussed with the patient. The patient and/or family concurred with the proposed plan, giving informed consent. The patient was brought to the cath lab after IV hydration was begun and oral premedication was given. The patient was further sedated with Versed and Fentanyl. The right wrist was assessed with an Allens test which was positive. The right wrist was prepped and draped in a sterile fashion. 1% lidocaine was used for local anesthesia. Using the modified Seldinger access technique, a 5 French sheath was placed in the right radial artery. 3 mg Verapamil was given through the sheath. 3000 units IV heparin was given. Standard diagnostic catheters were used to perform selective coronary angiography. A pigtail catheter was used to perform a left ventricular angiogram. The sheath was removed from the right radial artery and a Terumo hemostasis band was applied at the arteriotomy site on the right wrist.   There were no immediate complications. The patient was taken to the recovery area in stable condition.   Hemodynamic Findings: Central aortic pressure: 102/38 Left ventricular pressure: 106/2/10  Angiographic Findings:  Left main: No obstructive disease.   Left Anterior Descending Artery: Moderate caliber vessel that  courses to the apex. The vessel becomes very small in caliber at the apex. There are no obstructive lesions in the LAD. The diagonal branch is moderate in caliber with a patent stent. There is diffuse 40% restenosis within the stent. This does not appear to be flow limiting. Just beyond the stent there is a 40% stenosis.   Circumflex Artery: Large caliber vessel that continues as a large obtuse marginal branch. The proximal Circumflex has a focal 40% stenosis. The ostium of the first OM branch has 30% stenosis. The continuation of the AV groove Circumflex beyond the takeoff of the OM branch is small in caliber.   Right Coronary Artery: Large dominant vessel with diffuse 40% mid stenosis. There is mild disease in the posterolateral branch and PDA.   Left Ventricular Angiogram: LVEF=40%, hypokinesis of the anterior wall, apex.   Impression: 1. Moderate non-obstructive CAD 2. Moderate segmental LV systolic dysfunction  Recommendations: Continue medical management of CAD.        Complications:  None. The patient tolerated the procedure well.

## 2012-12-20 ENCOUNTER — Telehealth: Payer: Self-pay | Admitting: *Deleted

## 2012-12-20 DIAGNOSIS — I2510 Atherosclerotic heart disease of native coronary artery without angina pectoris: Secondary | ICD-10-CM

## 2012-12-20 MED ORDER — POTASSIUM CHLORIDE CRYS ER 20 MEQ PO TBCR: 20.0000 meq | EXTENDED_RELEASE_TABLET | Freq: Every day | ORAL | Status: DC

## 2012-12-20 NOTE — Telephone Encounter (Signed)
Inpatient Notes    Alexa Hazel, MD     Sent: Wed December 18, 2012  9:57 AM    To: Dossie Arbour, RN       Message    [name], Can we have her come back by early next week for a BMET? I have written a prescription for KDur po Qdaily for her. Chris     Pt notified of above instructions from Dr. Clifton James. She will come in Monday for a BMP. She will be going out of town on Tuesday and if she can't be reached on her cell phone we can contact her daughter (phone [phone]) if needed.

## 2012-12-23 ENCOUNTER — Other Ambulatory Visit (INDEPENDENT_AMBULATORY_CARE_PROVIDER_SITE_OTHER): Payer: Medicare Other

## 2012-12-23 ENCOUNTER — Ambulatory Visit (HOSPITAL_COMMUNITY): Payer: Medicare Other

## 2012-12-23 DIAGNOSIS — I2510 Atherosclerotic heart disease of native coronary artery without angina pectoris: Secondary | ICD-10-CM

## 2012-12-23 LAB — BASIC METABOLIC PANEL
BUN: 17 mg/dL (ref 6–23)
CO2: 25 mEq/L (ref 19–32)
Calcium: 9.1 mg/dL (ref 8.4–10.5)
Chloride: 107 mEq/L (ref 96–112)
Creatinine, Ser: 1 mg/dL (ref 0.4–1.2)
GFR: 60.46 mL/min (ref >60.00)
Glucose, Bld: 97 mg/dL (ref 70–99)
Potassium: 3.9 mEq/L (ref 3.5–5.1)
Sodium: 136 mEq/L (ref 135–145)

## 2013-01-13 ENCOUNTER — Ambulatory Visit (HOSPITAL_COMMUNITY)
Admission: RE | Admit: 2013-01-13 | Discharge: 2013-01-13 | Disposition: A | Discharge status: Home / Self Care | Payer: Medicare Other | Source: Ambulatory Visit | Attending: Internal Medicine | Admitting: Internal Medicine

## 2013-01-13 DIAGNOSIS — Z1231 Encounter for screening mammogram for malignant neoplasm of breast: Secondary | ICD-10-CM | POA: Diagnosis not present

## 2013-01-28 ENCOUNTER — Ambulatory Visit (INDEPENDENT_AMBULATORY_CARE_PROVIDER_SITE_OTHER): Payer: Medicare Other | Admitting: Cardiovascular Disease

## 2013-01-28 ENCOUNTER — Encounter: Payer: Self-pay | Admitting: Cardiovascular Disease

## 2013-01-28 VITALS — BP 118/78 | HR 68 | Ht 64.0 in | Wt 133.0 lb

## 2013-01-28 DIAGNOSIS — E785 Hyperlipidemia, unspecified: Secondary | ICD-10-CM

## 2013-01-28 DIAGNOSIS — I2510 Atherosclerotic heart disease of native coronary artery without angina pectoris: Secondary | ICD-10-CM | POA: Diagnosis not present

## 2013-01-28 MED ORDER — NITROGLYCERIN 0.4 MG SL SUBL: 0.4000 mg | SUBLINGUAL_TABLET | SUBLINGUAL | Status: DC | PRN

## 2013-01-28 NOTE — Progress Notes (Signed)
History of Present Illness: 79 yo WF with history of CAD, HTN, HLD here today for cardiac follow up. She has been followed in the past by Dr. Deborah Chalk. She had an anterolateral MI in 2001 with stent placement in the Diagonal branch of the LAD. Last cath was in 2007. I saw her on 11/28/12 and arranged a stress myoview since she had no recent ischemic testing. She had no specific complaints at that time. Stress myoview 12/11/12 with large scar in the anterior wall, LVEF not calculated. She exercised for 4 minutes and had fatigue and SOB. Cardiac cath 1001/14 with mild to moderate disease in the diagonal, Circumflex and RCA.   She is here for f/u. No chest pain. She is feeling well. She has her BP readings from last 30 days and they have been well controlled.   Primary Care Physician: Earl Gala  Last Lipid Profile:  Lipid Panel     Component Value Date/Time   CHOL 153 12/11/2012 0804   TRIG 133.0 12/11/2012 0804   HDL 49.10 12/11/2012 0804   CHOLHDL 3 12/11/2012 0804   VLDL 26.6 12/11/2012 0804   LDLCALC 77 12/11/2012 0804     Past Medical History  Diagnosis Date  . Coronary artery disease     had diffuse three-vessel coronary disease without focal stenosis at that time -- Last cath was in 2007showed anterior wall hypokinesis with well-preserved apical and inferior wall motion with and EF of approximately 45% -- last cardiolite study 12/09 this showed an EF of 47% with anterior akinesis, anterior scar , & no ischemia general  . Hypercholesterolemia   . History of shingles   . History of acute anterior wall myocardial infarction 04/25/1999    with a stent placement in her first diagonal vessel, and subsequent cutting balloon to the diagonal in 2001  . Hypertension   . Fatigue   . Weakness     Past Surgical History  Procedure Laterality Date  . Cholecystectomy    . Breast biopsy      Bilateral  . Abdominal hysterectomy      Current Outpatient Prescriptions  Medication Sig Dispense  Refill  . aspirin 81 MG tablet Take 81 mg by mouth daily.        . calcium carbonate (OS-CAL) 600 MG TABS Take 600 mg by mouth daily.        . Cholecalciferol (VITAMIN D3) 1000 UNITS CAPS Take 1,000 mg by mouth daily.        Marland Kitchen estrogens, conjugated, (PREMARIN) 0.3 MG tablet Take 0.3 mg by mouth every 3 (three) days.       . isosorbide mononitrate (IMDUR) 30 MG 24 hr tablet Take 15 mg by mouth daily.      Marland Kitchen losartan-hydrochlorothiazide (HYZAAR) 100-12.5 MG per tablet Take 1 tablet by mouth daily.      Marland Kitchen losartan-hydrochlorothiazide (HYZAAR) 100-12.5 MG per tablet Take 1 tablet by mouth daily.      . metoprolol succinate (TOPROL-XL) 50 MG 24 hr tablet Take 1 tablet (50 mg total) by mouth daily. Take with or immediately following a meal.  30 tablet  4  . nitroGLYCERIN (NITROSTAT) 0.4 MG SL tablet Place 1 tablet (0.4 mg total) under the tongue every 5 (five) minutes as needed for chest pain.  25 tablet  6  . potassium chloride SA (K-DUR,KLOR-CON) 20 MEQ tablet Take 1 tablet (20 mEq total) by mouth daily.  30 tablet  3  . simvastatin (ZOCOR) 40 MG tablet Take 20 mg  by mouth every evening.       No current facility-administered medications for this visit.    Allergies  Allergen Reactions  . Alendronate Sodium     REACTION: Reaction not known  . Risedronate Sodium     REACTION: Reaction not known    History   Social History  . Marital Status: Married    Spouse Name: N/A    Number of Children: 3  . Years of Education: N/A   Occupational History  . Retired-[employer]    Social History Main Topics  . Smoking status: Never Smoker   . Smokeless tobacco: Not on file  . Alcohol Use: No  . Drug Use: No  . Sexual Activity: Not on file   Other Topics Concern  . Not on file   Social History Narrative  . No narrative on file    Family History  Problem Relation Age of Onset  . Heart disease Neg Hx     Review of Systems:  As stated in the HPI and otherwise negative.   BP  118/78  Pulse 68  Ht 5\' 4"  (1.626 m)  Wt 133 lb (60.328 kg)  BMI 22.82 kg/m2  SpO2 98%  Physical Examination: General: Well developed, well nourished, NAD HEENT: OP clear, mucus membranes moist SKIN: warm, dry. No rashes. Neuro: No focal deficits Musculoskeletal: Muscle strength 5/5 all ext Psychiatric: Mood and affect normal Neck: No JVD, no carotid bruits, no thyromegaly, no lymphadenopathy. Lungs:Clear bilaterally, no wheezes, rhonci, crackles Cardiovascular: Regular rate and rhythm. No murmurs, gallops or rubs. Abdomen:Soft. Bowel sounds present. Non-tender.  Extremities: No lower extremity edema. Pulses are 2 + in the bilateral DP/PT.  Stress myoview 12/11/12: Stress Procedure: The patient exercised on the treadmill utilizing the Bruce Protocol for 4:31 minutes. The patient stopped due to extreme fatigue, sob, and denied any chest pain. Technetium 2m Sestamibi was injected at peak exercise and myocardial perfusion imaging was performed after a brief delay.  Stress ECG: Further inferolateral ST segment depression and T wave inversion in recovery  QPS  Raw Data Images: Normal; no motion artifact; normal heart/lung ratio.  Stress Images: There is decreased uptake in the anterior wall.  Rest Images: There is decreased uptake in the anterior wall.  Subtraction (SDS): There is a fixed defect that is most consistent with a previous infarction.  Transient Ischemic Dilatation (Normal <1.22): n/a  Lung/Heart Ratio (Normal <0.45): 0.35  Quantitative Gated Spect Images  QGS EDV: n/a  QGS ESV: n/a  Impression  Exercise Capacity: Fair exercise capacity.  BP Response: Normal blood pressure response.  Clinical Symptoms: There is dyspnea.  ECG Impression: Baseline ECG changes make stress ECG non diagnostic  Comparison with Prior Nuclear Study: No images to compare  Overall Impression: Intermediate risk stress nuclear study Large anterior wall infarct from apex to base with no ischemia.    LV Ejection Fraction: Study not gated. LV Wall Motion: Study not gated  EKG: Sinus, rate 69 bpm. PVC. Old septal infarct.   Cardiac cath 12/18/12: Left main: No obstructive disease.  Left Anterior Descending Artery: Moderate caliber vessel that courses to the apex. The vessel becomes very small in caliber at the apex. There are no obstructive lesions in the LAD. The diagonal branch is moderate in caliber with a patent stent. There is diffuse 40% restenosis within the stent. This does not appear to be flow limiting. Just beyond the stent there is a 40% stenosis.  Circumflex Artery: Large caliber vessel that continues  as a large obtuse marginal branch. The proximal Circumflex has a focal 40% stenosis. The ostium of the first OM branch has 30% stenosis. The continuation of the AV groove Circumflex beyond the takeoff of the OM branch is small in caliber.  Right Coronary Artery: Large dominant vessel with diffuse 40% mid stenosis. There is mild disease in the posterolateral branch and PDA.  Left Ventricular Angiogram: LVEF=40%, hypokinesis of the anterior wall, apex.  Impression:  1. Moderate non-obstructive CAD  2. Moderate segmental LV systolic dysfunction  Recommendations: Continue medical management of CAD.   Assessment and Plan:   1. CAD: Recent cath 12/18/12 with stable mild to moderate CAD. BP and lipids are well controlled. No chest pain. No changes. Continue current medical therapy.   2. HTN: BP well controlled. No changes.   3. Hyperlipidemia: Continue statin. Lipids well controlled.

## 2013-01-28 NOTE — Patient Instructions (Signed)
Your physician wants you to follow-up in:  6 months. You will receive a reminder letter in the mail two months in advance. If you don't receive a letter, please call our office to schedule the follow-up appointment.   

## 2013-04-14 ENCOUNTER — Other Ambulatory Visit: Payer: Self-pay | Admitting: Cardiovascular Disease

## 2013-04-16 ENCOUNTER — Other Ambulatory Visit: Payer: Self-pay | Admitting: *Deleted

## 2013-04-16 MED ORDER — ISOSORBIDE MONONITRATE ER 30 MG PO TB24: 15.0000 mg | ORAL_TABLET | Freq: Every day | ORAL | Status: DC

## 2013-04-17 ENCOUNTER — Other Ambulatory Visit: Payer: Self-pay | Admitting: *Deleted

## 2013-04-17 MED ORDER — METOPROLOL SUCCINATE ER 50 MG PO TB24: 50.0000 mg | ORAL_TABLET | Freq: Every day | ORAL | Status: DC

## 2013-05-09 ENCOUNTER — Telehealth: Payer: Self-pay | Admitting: Cardiovascular Disease

## 2013-05-09 NOTE — Telephone Encounter (Signed)
New Prob    Pt wants to know if she needs any blood work for 5/20 visit. Please call.

## 2013-05-09 NOTE — Telephone Encounter (Signed)
Lipid and liver profiles last checked in September 2014. BMP in October 2014. I spoke with pt and told her she did not need lab work prior to appt with Dr. Angelena Form in May

## 2013-05-14 ENCOUNTER — Other Ambulatory Visit: Payer: Self-pay | Admitting: Cardiovascular Disease

## 2013-08-06 ENCOUNTER — Ambulatory Visit (INDEPENDENT_AMBULATORY_CARE_PROVIDER_SITE_OTHER): Payer: Medicare Other | Admitting: Cardiovascular Disease

## 2013-08-06 ENCOUNTER — Encounter: Payer: Self-pay | Admitting: Cardiovascular Disease

## 2013-08-06 VITALS — BP 122/70 | HR 64 | Ht 64.0 in | Wt 129.0 lb

## 2013-08-06 DIAGNOSIS — I10 Essential (primary) hypertension: Secondary | ICD-10-CM

## 2013-08-06 DIAGNOSIS — Z23 Encounter for immunization: Secondary | ICD-10-CM | POA: Diagnosis not present

## 2013-08-06 DIAGNOSIS — I2510 Atherosclerotic heart disease of native coronary artery without angina pectoris: Secondary | ICD-10-CM

## 2013-08-06 DIAGNOSIS — M810 Age-related osteoporosis without current pathological fracture: Secondary | ICD-10-CM | POA: Diagnosis not present

## 2013-08-06 DIAGNOSIS — Z1331 Encounter for screening for depression: Secondary | ICD-10-CM | POA: Diagnosis not present

## 2013-08-06 DIAGNOSIS — Z0000 Encounter for general adult medical examination without abnormal findings: Secondary | ICD-10-CM | POA: Diagnosis not present

## 2013-08-06 DIAGNOSIS — E039 Hypothyroidism, unspecified: Secondary | ICD-10-CM | POA: Diagnosis not present

## 2013-08-06 DIAGNOSIS — E785 Hyperlipidemia, unspecified: Secondary | ICD-10-CM | POA: Diagnosis not present

## 2013-08-06 DIAGNOSIS — I252 Old myocardial infarction: Secondary | ICD-10-CM | POA: Diagnosis not present

## 2013-08-06 NOTE — Patient Instructions (Signed)
Your physician wants you to follow-up in:  12 months.  You will receive a reminder letter in the mail two months in advance. If you don't receive a letter, please call our office to schedule the follow-up appointment.   

## 2013-08-06 NOTE — Progress Notes (Signed)
History of Present Illness: 80 yo WF with history of CAD, HTN, HLD here today for cardiac follow up. She has been followed in the past by Dr. Doreatha Lew. She had an anterolateral MI in 2001 with stent placement in the Diagonal branch of the LAD. Last cath was in 2007. I saw her on 11/28/12 and arranged a stress myoview since she had no recent ischemic testing. She had no specific complaints at that time. Stress myoview 12/11/12 with large scar in the anterior wall, LVEF not calculated. She exercised for 4 minutes and had fatigue and SOB. Cardiac cath 12/18/12 with mild to moderate disease in the diagonal, Circumflex and RCA.   She is here for f/u. No chest pain. She is feeling well. No SOB.   Primary Care Physician: Maxwell Caul  Last Lipid Profile:  Lipid Panel     Component Value Date/Time   CHOL 153 12/11/2012 0804   TRIG 133.0 12/11/2012 0804   HDL 49.10 12/11/2012 0804   CHOLHDL 3 12/11/2012 0804   VLDL 26.6 12/11/2012 0804   LDLCALC 77 12/11/2012 0804     Past Medical History  Diagnosis Date  . Coronary artery disease     had diffuse three-vessel coronary disease without focal stenosis at that time -- Last cath was in 2007showed anterior wall hypokinesis with well-preserved apical and inferior wall motion with and EF of approximately 45% -- last cardiolite study 12/09 this showed an EF of 47% with anterior akinesis, anterior scar , & no ischemia general  . Hypercholesterolemia   . History of shingles   . History of acute anterior wall myocardial infarction 04/25/1999    with a stent placement in her first diagonal vessel, and subsequent cutting balloon to the diagonal in 2001  . Hypertension   . Fatigue   . Weakness     Past Surgical History  Procedure Laterality Date  . Cholecystectomy    . Breast biopsy      Bilateral  . Abdominal hysterectomy      Current Outpatient Prescriptions  Medication Sig Dispense Refill  . aspirin 81 MG tablet Take 81 mg by mouth daily.        .  calcium carbonate (OS-CAL) 600 MG TABS Take 600 mg by mouth daily.        . Cholecalciferol (VITAMIN D3) 1000 UNITS CAPS Take 1,000 mg by mouth daily.        Marland Kitchen estrogens, conjugated, (PREMARIN) 0.3 MG tablet Take 0.3 mg by mouth every 3 (three) days.       . isosorbide mononitrate (IMDUR) 30 MG 24 hr tablet Take 0.5 tablets (15 mg total) by mouth daily.  15 tablet  3  . losartan-hydrochlorothiazide (HYZAAR) 100-12.5 MG per tablet Take 1 tablet by mouth daily.      . metoprolol succinate (TOPROL-XL) 50 MG 24 hr tablet Take 1 tablet (50 mg total) by mouth daily.  30 tablet  3  . nitroGLYCERIN (NITROSTAT) 0.4 MG SL tablet Place 1 tablet (0.4 mg total) under the tongue every 5 (five) minutes as needed for chest pain.  25 tablet  6  . potassium chloride SA (K-DUR,KLOR-CON) 20 MEQ tablet Take 1 tablet (20 mEq total) by mouth daily.  30 tablet  3  . simvastatin (ZOCOR) 40 MG tablet TAKE 1/2 TABLET BY MOUTH AT BEDTIME  30 tablet  2   No current facility-administered medications for this visit.    Allergies  Allergen Reactions  . Alendronate Sodium     REACTION:  Reaction not known  . Risedronate Sodium     REACTION: Reaction not known    History   Social History  . Marital Status: Married    Spouse Name: N/A    Number of Children: 3  . Years of Education: N/A   Occupational History  . Retired-[employer]    Social History Main Topics  . Smoking status: Never Smoker   . Smokeless tobacco: Not on file  . Alcohol Use: No  . Drug Use: No  . Sexual Activity: Not on file   Other Topics Concern  . Not on file   Social History Narrative  . No narrative on file    Family History  Problem Relation Age of Onset  . Heart disease Neg Hx     Review of Systems:  As stated in the HPI and otherwise negative.   BP 122/70  Pulse 64  Ht 5\' 4"  (1.626 m)  Wt 129 lb (58.514 kg)  BMI 22.13 kg/m2  Physical Examination: General: Well developed, well nourished, NAD HEENT: OP clear,  mucus membranes moist SKIN: warm, dry. No rashes. Neuro: No focal deficits Musculoskeletal: Muscle strength 5/5 all ext Psychiatric: Mood and affect normal Neck: No JVD, no carotid bruits, no thyromegaly, no lymphadenopathy. Lungs:Clear bilaterally, no wheezes, rhonci, crackles Cardiovascular: Regular rate and rhythm. No murmurs, gallops or rubs. Abdomen:Soft. Bowel sounds present. Non-tender.  Extremities: No lower extremity edema. Pulses are 2 + in the bilateral DP/PT.  Stress myoview 12/11/12: Stress Procedure: The patient exercised on the treadmill utilizing the Bruce Protocol for 4:31 minutes. The patient stopped due to extreme fatigue, sob, and denied any chest pain. Technetium 23m Sestamibi was injected at peak exercise and myocardial perfusion imaging was performed after a brief delay.  Stress ECG: Further inferolateral ST segment depression and T wave inversion in recovery  QPS  Raw Data Images: Normal; no motion artifact; normal heart/lung ratio.  Stress Images: There is decreased uptake in the anterior wall.  Rest Images: There is decreased uptake in the anterior wall.  Subtraction (SDS): There is a fixed defect that is most consistent with a previous infarction.  Transient Ischemic Dilatation (Normal <1.22): n/a  Lung/Heart Ratio (Normal <0.45): 0.35  Quantitative Gated Spect Images  QGS EDV: n/a  QGS ESV: n/a  Impression  Exercise Capacity: Fair exercise capacity.  BP Response: Normal blood pressure response.  Clinical Symptoms: There is dyspnea.  ECG Impression: Baseline ECG changes make stress ECG non diagnostic  Comparison with Prior Nuclear Study: No images to compare  Overall Impression: Intermediate risk stress nuclear study Large anterior wall infarct from apex to base with no ischemia.  LV Ejection Fraction: Study not gated. LV Wall Motion: Study not gated  EKG: Sinus, rate 69 bpm. PVC. Old septal infarct.   Cardiac cath 12/18/12: Left main: No obstructive  disease.  Left Anterior Descending Artery: Moderate caliber vessel that courses to the apex. The vessel becomes very small in caliber at the apex. There are no obstructive lesions in the LAD. The diagonal branch is moderate in caliber with a patent stent. There is diffuse 40% restenosis within the stent. This does not appear to be flow limiting. Just beyond the stent there is a 40% stenosis.  Circumflex Artery: Large caliber vessel that continues as a large obtuse marginal branch. The proximal Circumflex has a focal 40% stenosis. The ostium of the first OM branch has 30% stenosis. The continuation of the AV groove Circumflex beyond the takeoff of the OM  branch is small in caliber.  Right Coronary Artery: Large dominant vessel with diffuse 40% mid stenosis. There is mild disease in the posterolateral branch and PDA.  Left Ventricular Angiogram: LVEF=40%, hypokinesis of the anterior wall, apex.  Impression:  1. Moderate non-obstructive CAD  2. Moderate segmental LV systolic dysfunction  Recommendations: Continue medical management of CAD.   Assessment and Plan:   1. CAD: Cardiac cath 12/18/12 with stable mild to moderate CAD. BP and lipids are well controlled. No chest pain. No changes. Continue current medical therapy.   2. HTN: BP well controlled. No changes.   3. Hyperlipidemia: Continue statin. Lipids well controlled.

## 2013-08-11 ENCOUNTER — Other Ambulatory Visit: Payer: Self-pay | Admitting: Cardiovascular Disease

## 2013-11-06 ENCOUNTER — Other Ambulatory Visit: Payer: Self-pay | Admitting: Cardiovascular Disease

## 2013-12-03 DIAGNOSIS — Z79890 Hormone replacement therapy (postmenopausal): Secondary | ICD-10-CM | POA: Diagnosis not present

## 2013-12-03 DIAGNOSIS — Z01419 Encounter for gynecological examination (general) (routine) without abnormal findings: Secondary | ICD-10-CM | POA: Diagnosis not present

## 2013-12-03 DIAGNOSIS — Z0000 Encounter for general adult medical examination without abnormal findings: Secondary | ICD-10-CM | POA: Diagnosis not present

## 2013-12-11 DIAGNOSIS — Z23 Encounter for immunization: Secondary | ICD-10-CM | POA: Diagnosis not present

## 2013-12-15 ENCOUNTER — Other Ambulatory Visit (HOSPITAL_COMMUNITY): Payer: Self-pay | Admitting: Internal Medicine

## 2013-12-15 DIAGNOSIS — Z1231 Encounter for screening mammogram for malignant neoplasm of breast: Secondary | ICD-10-CM

## 2013-12-16 ENCOUNTER — Other Ambulatory Visit: Payer: Self-pay | Admitting: Cardiovascular Disease

## 2014-01-19 ENCOUNTER — Ambulatory Visit (HOSPITAL_COMMUNITY)
Admission: RE | Admit: 2014-01-19 | Discharge: 2014-01-19 | Disposition: A | Discharge status: Home / Self Care | Payer: Medicare Other | Source: Ambulatory Visit | Attending: Internal Medicine | Admitting: Internal Medicine

## 2014-01-19 DIAGNOSIS — G43909 Migraine, unspecified, not intractable, without status migrainosus: Secondary | ICD-10-CM | POA: Diagnosis not present

## 2014-01-19 DIAGNOSIS — Z1231 Encounter for screening mammogram for malignant neoplasm of breast: Secondary | ICD-10-CM | POA: Insufficient documentation

## 2014-01-19 DIAGNOSIS — H01001 Unspecified blepharitis right upper eyelid: Secondary | ICD-10-CM | POA: Diagnosis not present

## 2014-01-19 DIAGNOSIS — H43813 Vitreous degeneration, bilateral: Secondary | ICD-10-CM | POA: Diagnosis not present

## 2014-01-19 DIAGNOSIS — H04123 Dry eye syndrome of bilateral lacrimal glands: Secondary | ICD-10-CM | POA: Diagnosis not present

## 2014-02-26 ENCOUNTER — Encounter (HOSPITAL_COMMUNITY): Payer: Self-pay | Admitting: Cardiovascular Disease

## 2014-05-06 ENCOUNTER — Ambulatory Visit (INDEPENDENT_AMBULATORY_CARE_PROVIDER_SITE_OTHER): Payer: Medicare Other | Admitting: Podiatrist

## 2014-05-06 ENCOUNTER — Encounter: Payer: Self-pay | Admitting: Podiatrist

## 2014-05-06 VITALS — BP 118/59 | HR 76 | Resp 16

## 2014-05-06 DIAGNOSIS — B351 Tinea unguium: Secondary | ICD-10-CM | POA: Diagnosis not present

## 2014-05-06 DIAGNOSIS — M79673 Pain in unspecified foot: Secondary | ICD-10-CM | POA: Diagnosis not present

## 2014-05-06 DIAGNOSIS — L600 Ingrowing nail: Secondary | ICD-10-CM

## 2014-05-06 NOTE — Progress Notes (Signed)
   Subjective:    Patient ID: [name], female    DOB: Nov 26, 1932, 81 y.o.   MRN: [id number]  HPI Comments: "I have a toe that hurts"  Patient c/o tender 1st toe left, medial border, for few weeks. The toenail is thick and discolored and turned under into skin. She tried to trim but get hard to do. The skin around is slightly red.  Toe Pain       Review of Systems  All other systems reviewed and are negative.      Objective:   Physical Exam  Patient is awake, alert, and oriented x 3.  In no acute distress.  Vascular status is intact with palpable pedal pulses at 2/4 DP and PT bilateral and capillary refill time within normal limits. Neurological sensation is also intact bilaterally via Semmes Weinstein monofilament at 5/5 sites. Light touch, vibratory sensation, Achilles tendon reflex is intact. Dermatological exam reveals skin color, turger and texture as normal. No open lesions present.  Musculature intact with dorsiflexion, plantarflexion, inversion, eversion.  Bilateral great toenails are thick, elongated, dystrophic with mycotic appearance present.  Remainder of toenails are asymptomatic.      Assessment & Plan:  Symptomatic, mycotic hallux nails  Plan:  Debridement of great toenails was accomplished without complication.  She will call if she has any problems or concerns.

## 2014-05-06 NOTE — Patient Instructions (Signed)

## 2014-06-25 ENCOUNTER — Other Ambulatory Visit: Payer: Self-pay | Admitting: Cardiovascular Disease

## 2014-08-10 ENCOUNTER — Other Ambulatory Visit: Payer: Self-pay | Admitting: Cardiovascular Disease

## 2014-08-10 ENCOUNTER — Encounter: Payer: Self-pay | Admitting: Cardiovascular Disease

## 2014-08-10 ENCOUNTER — Ambulatory Visit (INDEPENDENT_AMBULATORY_CARE_PROVIDER_SITE_OTHER): Payer: Medicare Other | Admitting: Cardiovascular Disease

## 2014-08-10 VITALS — BP 118/60 | HR 68 | Ht 64.0 in | Wt 129.0 lb

## 2014-08-10 DIAGNOSIS — I10 Essential (primary) hypertension: Secondary | ICD-10-CM

## 2014-08-10 DIAGNOSIS — E785 Hyperlipidemia, unspecified: Secondary | ICD-10-CM | POA: Diagnosis not present

## 2014-08-10 DIAGNOSIS — I2510 Atherosclerotic heart disease of native coronary artery without angina pectoris: Secondary | ICD-10-CM

## 2014-08-10 NOTE — Patient Instructions (Signed)
Medication Instructions:  Your physician recommends that you continue on your current medications as directed. Please refer to the Current Medication list given to you today.   Labwork: none  Testing/Procedures: none  Follow-Up: Your physician wants you to follow-up in:  12 months.  You will receive a reminder letter in the mail two months in advance. If you don't receive a letter, please call our office to schedule the follow-up appointment.        

## 2014-08-10 NOTE — Progress Notes (Signed)
Chief Complaint  Patient presents with  . Snoring    History of Present Illness: 81 yo WF with history of CAD, HTN, HLD here today for cardiac follow up. She has been followed in the past by Dr. Doreatha Lew. She had an anterolateral MI in 2001 with stent placement in the Diagonal branch of the LAD. Last cath was in 2007. I saw her on 11/28/12 and arranged a stress myoview since she had no recent ischemic testing. She had no specific complaints at that time. Stress myoview 12/11/12 with large scar in the anterior wall, LVEF not calculated. She exercised for 4 minutes and had fatigue and SOB. Cardiac cath 12/18/12 with mild to moderate disease in the diagonal, Circumflex and RCA.   She is here for f/u. No chest pain. She is feeling well. No SOB. She is very active. She has been working in her garden.   Primary Care Physician: Maxwell Caul   Past Medical History  Diagnosis Date  . Coronary artery disease     had diffuse three-vessel coronary disease without focal stenosis at that time -- Last cath was in 2007showed anterior wall hypokinesis with well-preserved apical and inferior wall motion with and EF of approximately 45% -- last cardiolite study 12/09 this showed an EF of 47% with anterior akinesis, anterior scar , & no ischemia general  . Hypercholesterolemia   . History of shingles   . History of acute anterior wall myocardial infarction 04/25/1999    with a stent placement in her first diagonal vessel, and subsequent cutting balloon to the diagonal in 2001  . Hypertension   . Fatigue   . Weakness     Past Surgical History  Procedure Laterality Date  . Cholecystectomy    . Breast biopsy      Bilateral  . Abdominal hysterectomy    . Left heart catheterization with coronary angiogram N/A 12/18/2012    Procedure: LEFT HEART CATHETERIZATION WITH CORONARY ANGIOGRAM;  Surgeon: Burnell Blanks, MD;  Location: [facility] CATH LAB;  Service: Cardiovascular;  Laterality: N/A;    Current Outpatient  Prescriptions  Medication Sig Dispense Refill  . aspirin 81 MG tablet Take 81 mg by mouth daily.      . calcium carbonate (OS-CAL) 600 MG TABS Take 600 mg by mouth daily.      . Cholecalciferol (VITAMIN D3) 1000 UNITS CAPS Take 1,000 mg by mouth daily.      Marland Kitchen estrogens, conjugated, (PREMARIN) 0.3 MG tablet Take 0.3 mg by mouth. EVERY FIVE (5) DAYS    . isosorbide mononitrate (IMDUR) 30 MG 24 hr tablet TAKE 1/2 TABLET BY MOUTH DAILY. 15 tablet 11  . KLOR-CON M20 20 MEQ tablet TAKE 1 TABLET BY MOUTH EVERY DAY 31 tablet 1  . losartan-hydrochlorothiazide (HYZAAR) 100-12.5 MG per tablet Take 1 tablet by mouth daily.    . metoprolol succinate (TOPROL-XL) 50 MG 24 hr tablet TAKE 1 TABLET (50 MG TOTAL) BY MOUTH DAILY. 30 tablet 11  . nitroGLYCERIN (NITROSTAT) 0.4 MG SL tablet Place 1 tablet (0.4 mg total) under the tongue every 5 (five) minutes as needed for chest pain. 25 tablet 6  . simvastatin (ZOCOR) 40 MG tablet TAKE 1/2 TABLET BY MOUTH AT BEDTIME 30 tablet 7   No current facility-administered medications for this visit.    Allergies  Allergen Reactions  . Alendronate Sodium Other (See Comments)    REACTION: Reaction not known  . Risedronate Sodium Other (See Comments)    REACTION: Reaction not known  History   Social History  . Marital Status: Married    Spouse Name: N/A  . Number of Children: 3  . Years of Education: N/A   Occupational History  . Retired-[employer]    Social History Main Topics  . Smoking status: Never Smoker   . Smokeless tobacco: Never Used  . Alcohol Use: No  . Drug Use: No  . Sexual Activity: Not on file   Other Topics Concern  . Not on file   Social History Narrative    Family History  Problem Relation Age of Onset  . Liver disease Mother   . Heart disease Father   . Dementia Sister   . Heart disease Brother   . Healthy Sister     Review of Systems:  As stated in the HPI and otherwise negative.   BP 118/60 mmHg  Pulse 68  Ht  5\' 4"  (1.626 m)  Wt 129 lb (58.514 kg)  BMI 22.13 kg/m2  Physical Examination: General: Well developed, well nourished, NAD HEENT: OP clear, mucus membranes moist SKIN: warm, dry. No rashes. Neuro: No focal deficits Musculoskeletal: Muscle strength 5/5 all ext Psychiatric: Mood and affect normal Neck: No JVD, no carotid bruits, no thyromegaly, no lymphadenopathy. Lungs:Clear bilaterally, no wheezes, rhonci, crackles Cardiovascular: Regular rate and rhythm. No murmurs, gallops or rubs. Abdomen:Soft. Bowel sounds present. Non-tender.  Extremities: No lower extremity edema. Pulses are 2 + in the bilateral DP/PT.  Stress myoview 12/11/12: Stress Procedure: The patient exercised on the treadmill utilizing the Bruce Protocol for 4:31 minutes. The patient stopped due to extreme fatigue, sob, and denied any chest pain. Technetium 3m Sestamibi was injected at peak exercise and myocardial perfusion imaging was performed after a brief delay.  Stress ECG: Further inferolateral ST segment depression and T wave inversion in recovery  QPS  Raw Data Images: Normal; no motion artifact; normal heart/lung ratio.  Stress Images: There is decreased uptake in the anterior wall.  Rest Images: There is decreased uptake in the anterior wall.  Subtraction (SDS): There is a fixed defect that is most consistent with a previous infarction.  Transient Ischemic Dilatation (Normal <1.22): n/a  Lung/Heart Ratio (Normal <0.45): 0.35  Quantitative Gated Spect Images  QGS EDV: n/a  QGS ESV: n/a  Impression  Exercise Capacity: Fair exercise capacity.  BP Response: Normal blood pressure response.  Clinical Symptoms: There is dyspnea.  ECG Impression: Baseline ECG changes make stress ECG non diagnostic  Comparison with Prior Nuclear Study: No images to compare  Overall Impression: Intermediate risk stress nuclear study Large anterior wall infarct from apex to base with no ischemia.  LV Ejection Fraction: Study not  gated. LV Wall Motion: Study not gated  Cardiac cath 12/18/12: Left main: No obstructive disease.  Left Anterior Descending Artery: Moderate caliber vessel that courses to the apex. The vessel becomes very small in caliber at the apex. There are no obstructive lesions in the LAD. The diagonal branch is moderate in caliber with a patent stent. There is diffuse 40% restenosis within the stent. This does not appear to be flow limiting. Just beyond the stent there is a 40% stenosis.  Circumflex Artery: Large caliber vessel that continues as a large obtuse marginal branch. The proximal Circumflex has a focal 40% stenosis. The ostium of the first OM branch has 30% stenosis. The continuation of the AV groove Circumflex beyond the takeoff of the OM branch is small in caliber.  Right Coronary Artery: Large dominant vessel with diffuse 40%  mid stenosis. There is mild disease in the posterolateral branch and PDA.  Left Ventricular Angiogram: LVEF=40%, hypokinesis of the anterior wall, apex.  Impression:  1. Moderate non-obstructive CAD  2. Moderate segmental LV systolic dysfunction  Recommendations: Continue medical management of CAD.   EKG:  EKG is ordered today. The ekg ordered today demonstrates NSR, rate 68 bpm. Septal infarct  Recent Labs: No results found for requested labs within last 365 days.   Lipid Panel    Component Value Date/Time   CHOL 153 12/11/2012 0804   TRIG 133.0 12/11/2012 0804   HDL 49.10 12/11/2012 0804   CHOLHDL 3 12/11/2012 0804   VLDL 26.6 12/11/2012 0804   LDLCALC 77 12/11/2012 0804     Wt Readings from Last 3 Encounters:  08/10/14 129 lb (58.514 kg)  08/06/13 129 lb (58.514 kg)  01/28/13 133 lb (60.328 kg)     Other studies Reviewed: Additional studies/ records that were reviewed today include: . Review of the above records demonstrates:    Assessment and Plan:   1. CAD: Stable. Cardiac cath 12/18/12 with stable mild to moderate CAD. No chest pain. Continue  current medical therapy.   2. HTN: BP well controlled. No changes.   3. Hyperlipidemia: Continue statin. Lipids well controlled.   Current medicines are reviewed at length with the patient today.  The patient does not have concerns regarding medicines.  The following changes have been made:  no change  Labs/ tests ordered today include:   Orders Placed This Encounter  Procedures  . EKG 12-Lead    Disposition:   FU with me in 12  months  Signed, Lauree Chandler, MD 08/10/2014 3:34 PM    [facility] [address], [place], [place]  [address] Phone: [phone]; Fax: [phone]

## 2014-08-23 ENCOUNTER — Other Ambulatory Visit: Payer: Self-pay | Admitting: Cardiovascular Disease

## 2014-08-25 ENCOUNTER — Other Ambulatory Visit: Payer: Self-pay | Admitting: Cardiovascular Disease

## 2014-08-26 DIAGNOSIS — I252 Old myocardial infarction: Secondary | ICD-10-CM | POA: Diagnosis not present

## 2014-08-26 DIAGNOSIS — Z0000 Encounter for general adult medical examination without abnormal findings: Secondary | ICD-10-CM | POA: Diagnosis not present

## 2014-08-26 DIAGNOSIS — I10 Essential (primary) hypertension: Secondary | ICD-10-CM | POA: Diagnosis not present

## 2014-08-26 DIAGNOSIS — I2510 Atherosclerotic heart disease of native coronary artery without angina pectoris: Secondary | ICD-10-CM | POA: Diagnosis not present

## 2014-08-26 DIAGNOSIS — M810 Age-related osteoporosis without current pathological fracture: Secondary | ICD-10-CM | POA: Diagnosis not present

## 2014-08-26 DIAGNOSIS — Z1389 Encounter for screening for other disorder: Secondary | ICD-10-CM | POA: Diagnosis not present

## 2014-11-23 ENCOUNTER — Other Ambulatory Visit: Payer: Self-pay | Admitting: Cardiovascular Disease

## 2014-12-08 DIAGNOSIS — Z23 Encounter for immunization: Secondary | ICD-10-CM | POA: Diagnosis not present

## 2015-01-06 ENCOUNTER — Other Ambulatory Visit: Payer: Self-pay

## 2015-01-06 DIAGNOSIS — Z1231 Encounter for screening mammogram for malignant neoplasm of breast: Secondary | ICD-10-CM

## 2015-02-05 ENCOUNTER — Ambulatory Visit
Admission: RE | Admit: 2015-02-05 | Discharge: 2015-02-05 | Disposition: A | Discharge status: Home / Self Care | Payer: Medicare Other | Source: Ambulatory Visit

## 2015-02-05 DIAGNOSIS — Z1231 Encounter for screening mammogram for malignant neoplasm of breast: Secondary | ICD-10-CM | POA: Diagnosis not present

## 2015-03-02 DIAGNOSIS — Z961 Presence of intraocular lens: Secondary | ICD-10-CM | POA: Diagnosis not present

## 2015-03-02 DIAGNOSIS — H52203 Unspecified astigmatism, bilateral: Secondary | ICD-10-CM | POA: Diagnosis not present

## 2015-03-02 DIAGNOSIS — H43813 Vitreous degeneration, bilateral: Secondary | ICD-10-CM | POA: Diagnosis not present

## 2015-03-02 DIAGNOSIS — H26493 Other secondary cataract, bilateral: Secondary | ICD-10-CM | POA: Diagnosis not present

## 2015-04-02 ENCOUNTER — Emergency Department (HOSPITAL_COMMUNITY): Payer: Medicare Other

## 2015-04-02 ENCOUNTER — Emergency Department (HOSPITAL_COMMUNITY)
Admission: EM | Admit: 2015-04-02 | Discharge: 2015-04-02 | Disposition: A | Discharge status: Home / Self Care | Payer: Medicare Other | Attending: Emergency Medicine | Admitting: Emergency Medicine

## 2015-04-02 ENCOUNTER — Encounter (HOSPITAL_COMMUNITY): Payer: Self-pay

## 2015-04-02 ENCOUNTER — Other Ambulatory Visit: Payer: Self-pay

## 2015-04-02 DIAGNOSIS — E7800 Pure hypercholesterolemia, unspecified: Secondary | ICD-10-CM | POA: Insufficient documentation

## 2015-04-02 DIAGNOSIS — I10 Essential (primary) hypertension: Secondary | ICD-10-CM | POA: Insufficient documentation

## 2015-04-02 DIAGNOSIS — Z79899 Other long term (current) drug therapy: Secondary | ICD-10-CM | POA: Diagnosis not present

## 2015-04-02 DIAGNOSIS — R0600 Dyspnea, unspecified: Secondary | ICD-10-CM

## 2015-04-02 DIAGNOSIS — R079 Chest pain, unspecified: Secondary | ICD-10-CM | POA: Diagnosis not present

## 2015-04-02 DIAGNOSIS — Z7982 Long term (current) use of aspirin: Secondary | ICD-10-CM | POA: Insufficient documentation

## 2015-04-02 DIAGNOSIS — I252 Old myocardial infarction: Secondary | ICD-10-CM | POA: Insufficient documentation

## 2015-04-02 DIAGNOSIS — I2510 Atherosclerotic heart disease of native coronary artery without angina pectoris: Secondary | ICD-10-CM | POA: Insufficient documentation

## 2015-04-02 DIAGNOSIS — R0602 Shortness of breath: Secondary | ICD-10-CM | POA: Diagnosis not present

## 2015-04-02 LAB — BASIC METABOLIC PANEL
Anion gap: 11 (ref 5–15)
BUN: 26 mg/dL — ABNORMAL HIGH (ref 6–20)
CO2: 22 mmol/L (ref 22–32)
Calcium: 9.2 mg/dL (ref 8.9–10.3)
Chloride: 105 mmol/L (ref 101–111)
Creatinine, Ser: 1.08 mg/dL — ABNORMAL HIGH (ref 0.44–1.00)
GFR calc Af Amer: 55 mL/min — ABNORMAL LOW (ref >60)
GFR calc non Af Amer: 47 mL/min — ABNORMAL LOW (ref >60)
Glucose, Bld: 115 mg/dL — ABNORMAL HIGH (ref 65–99)
Potassium: 3.6 mmol/L (ref 3.5–5.1)
Sodium: 138 mmol/L (ref 135–145)

## 2015-04-02 LAB — CBC
HCT: 44.1 % (ref 36.0–46.0)
Hemoglobin: 14.6 g/dL (ref 12.0–15.0)
MCH: 30.6 pg (ref 26.0–34.0)
MCHC: 33.1 g/dL (ref 30.0–36.0)
MCV: 92.5 fL (ref 78.0–100.0)
Platelets: 246 10*3/uL (ref 150–400)
RBC: 4.77 MIL/uL (ref 3.87–5.11)
RDW: 13.6 % (ref 11.5–15.5)
WBC: 14 10*3/uL — ABNORMAL HIGH (ref 4.0–10.5)

## 2015-04-02 LAB — HEPATIC FUNCTION PANEL
ALT: 15 U/L (ref 14–54)
AST: 22 U/L (ref 15–41)
Albumin: 3.7 g/dL (ref 3.5–5.0)
Alkaline Phosphatase: 62 U/L (ref 38–126)
Bilirubin, Direct: 0.2 mg/dL (ref 0.1–0.5)
Indirect Bilirubin: 0.5 mg/dL (ref 0.3–0.9)
Total Bilirubin: 0.7 mg/dL (ref 0.3–1.2)
Total Protein: 6.8 g/dL (ref 6.5–8.1)

## 2015-04-02 LAB — BRAIN NATRIURETIC PEPTIDE: B Natriuretic Peptide: 119.9 pg/mL — ABNORMAL HIGH (ref 0.0–100.0)

## 2015-04-02 LAB — I-STAT TROPONIN, ED
Troponin i, poc: 0.01 ng/mL (ref 0.00–0.08)
Troponin i, poc: 0.01 ng/mL (ref 0.00–0.08)

## 2015-04-02 LAB — D-DIMER, QUANTITATIVE: D-Dimer, Quant: 1.11 ug/mL-FEU — ABNORMAL HIGH (ref 0.00–0.50)

## 2015-04-02 MED ORDER — IOHEXOL 350 MG/ML SOLN
80.0000 mL | Freq: Once | INTRAVENOUS | Status: AC | PRN
  Administered 2015-04-02: 60 mL via INTRAVENOUS

## 2015-04-02 NOTE — ED Notes (Signed)
Patient transported to CT 

## 2015-04-02 NOTE — ED Notes (Signed)
Pt reports taking 4-81mg  ASA and 1 NTG this AM

## 2015-04-02 NOTE — ED Notes (Signed)
Patient here with shortness of breath and chest discomfort since 9pm last night, reports has been constant all night, non-radiating, now complains of weakness

## 2015-04-02 NOTE — ED Notes (Signed)
MD at bedside. 

## 2015-04-02 NOTE — ED Notes (Signed)
[name] daughter, call if needed.

## 2015-04-02 NOTE — ED Provider Notes (Signed)
CSN: [id number]     Arrival date & time 04/02/15  0749 History   First MD Initiated Contact with Patient 04/02/15 [phone]     Chief Complaint  Patient presents with  . Chest Pain  . Shortness of Breath     (Consider location/radiation/quality/duration/timing/severity/associated sxs/prior Treatment) Patient is a 82 y.o. female presenting with chest pain and shortness of breath. The history is provided by the patient (The patient states that she had some chest pain last night she's also had some shortness of breath symptoms have improved now).  Chest Pain Pain location:  L chest Pain quality: aching   Pain radiates to:  Does not radiate Pain radiates to the back: no   Pain severity:  Mild Onset quality:  Sudden Timing:  Constant Progression:  Waxing and waning Chronicity:  New Associated symptoms: shortness of breath   Associated symptoms: no abdominal pain, no back pain, no cough, no fatigue and no headache   Shortness of Breath Associated symptoms: chest pain   Associated symptoms: no abdominal pain, no cough, no headaches and no rash     Past Medical History  Diagnosis Date  . Coronary artery disease     had diffuse three-vessel coronary disease without focal stenosis at that time -- Last cath was in 2007showed anterior wall hypokinesis with well-preserved apical and inferior wall motion with and EF of approximately 45% -- last cardiolite study 12/09 this showed an EF of 47% with anterior akinesis, anterior scar , & no ischemia general  . Hypercholesterolemia   . History of shingles   . History of acute anterior wall myocardial infarction 04/25/1999    with a stent placement in her first diagonal vessel, and subsequent cutting balloon to the diagonal in 2001  . Hypertension   . Fatigue   . Weakness    Past Surgical History  Procedure Laterality Date  . Cholecystectomy    . Breast biopsy      Bilateral  . Abdominal hysterectomy    . Left heart catheterization with coronary  angiogram N/A 12/18/2012    Procedure: LEFT HEART CATHETERIZATION WITH CORONARY ANGIOGRAM;  Surgeon: Burnell Blanks, MD;  Location: [facility] CATH LAB;  Service: Cardiovascular;  Laterality: N/A;   Family History  Problem Relation Age of Onset  . Liver disease Mother   . Heart disease Father   . Dementia Sister   . Heart disease Brother   . Healthy Sister    Social History  Substance Use Topics  . Smoking status: Never Smoker   . Smokeless tobacco: Never Used  . Alcohol Use: No   OB History    No data available     Review of Systems  Constitutional: Negative for appetite change and fatigue.  HENT: Negative for congestion, ear discharge and sinus pressure.   Eyes: Negative for discharge.  Respiratory: Positive for shortness of breath. Negative for cough.   Cardiovascular: Positive for chest pain.  Gastrointestinal: Negative for abdominal pain and diarrhea.  Genitourinary: Negative for frequency and hematuria.  Musculoskeletal: Negative for back pain.  Skin: Negative for rash.  Neurological: Negative for seizures and headaches.  Psychiatric/Behavioral: Negative for hallucinations.      Allergies  Alendronate sodium and Risedronate sodium  Home Medications   Prior to Admission medications   Medication Sig Start Date End Date Taking? Authorizing Provider  aspirin 81 MG tablet Take 81 mg by mouth daily.     Yes Historical Provider, MD  calcium carbonate (OS-CAL) 600 MG TABS Take 600  mg by mouth daily.     Yes Historical Provider, MD  Cholecalciferol (VITAMIN D3) 1000 UNITS CAPS Take 1,000 mg by mouth daily.     Yes Historical Provider, MD  estrogens, conjugated, (PREMARIN) 0.3 MG tablet Take 0.3 mg by mouth as directed. EVERY four (4)  DAYS   Yes Historical Provider, MD  isosorbide mononitrate (IMDUR) 30 MG 24 hr tablet TAKE 1/2 TABLET BY MOUTH DAILY. 08/11/14  Yes Burnell Blanks, MD  KLOR-CON M20 20 MEQ tablet TAKE 1 TABLET BY MOUTH EVERY DAY 08/24/14  Yes  Burnell Blanks, MD  losartan-hydrochlorothiazide (HYZAAR) 100-12.5 MG per tablet Take 1 tablet by mouth daily.   Yes Historical Provider, MD  losartan-hydrochlorothiazide (HYZAAR) 100-12.5 MG per tablet TAKE 1 TABLET BY MOUTH DAILY 08/11/14  Yes Burnell Blanks, MD  metoprolol succinate (TOPROL-XL) 50 MG 24 hr tablet TAKE 1 TABLET (50 MG TOTAL) BY MOUTH DAILY. 08/11/14  Yes Burnell Blanks, MD  nitroGLYCERIN (NITROSTAT) 0.4 MG SL tablet Place 1 tablet (0.4 mg total) under the tongue every 5 (five) minutes as needed for chest pain. 01/28/13  Yes Burnell Blanks, MD  simvastatin (ZOCOR) 20 MG tablet Take 20 mg by mouth daily.   Yes Historical Provider, MD  simvastatin (ZOCOR) 40 MG tablet TAKE 1/2 TABLET BY MOUTH AT BEDTIME Patient not taking: Reported on 04/02/2015 11/24/14   Burnell Blanks, MD   BP 119/60 mmHg  Pulse 84  Temp(Src) 98.1 F (36.7 C) (Oral)  Resp 20  Ht 5\' 4"  (1.626 m)  Wt 127 lb (57.607 kg)  BMI 21.79 kg/m2  SpO2 100% Physical Exam  Constitutional: She is oriented to person, place, and time. She appears well-developed.  HENT:  Head: Normocephalic.  Eyes: Conjunctivae and EOM are normal. No scleral icterus.  Neck: Neck supple. No thyromegaly present.  Cardiovascular: Normal rate and regular rhythm.  Exam reveals no gallop and no friction rub.   No murmur heard. Pulmonary/Chest: No stridor. She has no wheezes. She has no rales. She exhibits no tenderness.  Abdominal: She exhibits no distension. There is no tenderness. There is no rebound.  Musculoskeletal: Normal range of motion. She exhibits no edema.  Lymphadenopathy:    She has no cervical adenopathy.  Neurological: She is oriented to person, place, and time. She exhibits normal muscle tone. Coordination normal.  Skin: No rash noted. No erythema.  Psychiatric: She has a normal mood and affect. Her behavior is normal.    ED Course  Procedures (including critical care time) Labs  Review Labs Reviewed  BASIC METABOLIC PANEL - Abnormal; Notable for the following:    Glucose, Bld 115 (*)    BUN 26 (*)    Creatinine, Ser 1.08 (*)    GFR calc non Af Amer 47 (*)    GFR calc Af Amer 55 (*)    All other components within normal limits  CBC - Abnormal; Notable for the following:    WBC 14.0 (*)    All other components within normal limits  D-DIMER, QUANTITATIVE (NOT AT [facility]) - Abnormal; Notable for the following:    D-Dimer, Quant 1.11 (*)    All other components within normal limits  BRAIN NATRIURETIC PEPTIDE - Abnormal; Notable for the following:    B Natriuretic Peptide 119.9 (*)    All other components within normal limits  HEPATIC FUNCTION PANEL  I-STAT TROPOININ, ED  Randolm Idol, ED    Imaging Review Dg Chest 2 View  04/02/2015  CLINICAL DATA:  82 year old female with shortness of breath and chest pain for 1 day. Initial encounter. EXAM: CHEST  2 VIEW COMPARISON:  06/14/2012 and earlier. FINDINGS: Chronic hyperinflation with increased AP dimension to the chest. Normal cardiac size and mediastinal contours. Visualized tracheal air column is within normal limits. Stable mild apical scarring. EKG button artifact projects over both upper lobes. No pneumothorax, pulmonary edema, pleural effusion or confluent pulmonary opacity. Stable cholecystectomy clips. Osteopenia. No acute osseous abnormality identified. Calcified aortic atherosclerosis. IMPRESSION: Chronic hyperinflation.  No acute cardiopulmonary abnormality. Electronically Signed   By: Genevie Ann M.D.   On: 04/02/2015 08:10   Ct Angio Chest Pe W/cm &/or Wo Cm  04/02/2015  CLINICAL DATA:  Shortness of breath and chest discomfort beginning last evening. Now with weakness. EXAM: CT ANGIOGRAPHY CHEST WITH CONTRAST TECHNIQUE: Multidetector CT imaging of the chest was performed using the standard protocol during bolus administration of intravenous contrast. Multiplanar CT image reconstructions and MIPs were obtained to  evaluate the vascular anatomy. CONTRAST:  68mL OMNIPAQUE IOHEXOL 350 MG/ML SOLN COMPARISON:  Current chest radiographs. FINDINGS: Angiographic study: No evidence of a pulmonary embolus. Great vessels are normal in caliber. No aortic dissection. Neck base and axilla:  No mass or adenopathy. Mediastinum and hila: Heart normal in size and configuration. Coronary artery calcifications. No mediastinal or hilar masses or enlarged lymph nodes. Lungs and pleura: Apical pleural parenchymal scarring. Minor dependent subsegmental atelectasis in the lower lobes. No lung consolidation or edema. No mass or suspicious nodule. No pleural effusion or pneumothorax. Limited upper abdomen: Status post cholecystectomy. No acute findings. Musculoskeletal: Degenerative changes throughout the visualized spine. Bones are demineralized. No osteoblastic or osteolytic lesions. Review of the MIP images confirms the above findings. IMPRESSION: 1. No evidence of a pulmonary embolism. 2. No acute findings. Electronically Signed   By: Lajean Manes M.D.   On: 04/02/2015 13:21   I have personally reviewed and evaluated these images and lab results as part of my medical decision-making.   EKG Interpretation   Date/Time:  Friday April 02 2015 07:52:07 EST Ventricular Rate:  90 PR Interval:  152 QRS Duration: 96 QT Interval:  366 QTC Calculation: 447 R Axis:   -72 Text Interpretation:  Sinus rhythm with frequent Premature ventricular  complexes Left anterior fascicular block Anteroseptal infarct , age  undetermined Abnormal ECG Confirmed by Aleesha Ringstad  MD, Dorrien Grunder [phone] on  04/02/2015 8:19:52 AM      MDM   Final diagnoses:  Dyspnea    Labs unremarkable including 2 troponins. EKG shows no acute changes. CT scan of the chest was unremarkable patient will be discharged home to follow-up with her cardiologist she's not having any chest pain or shortness of breath at this time    Milton Ferguson, MD 04/02/15 1534

## 2015-04-02 NOTE — Discharge Instructions (Signed)
Follow up with your heart md next week. Call today to set up appointment

## 2015-04-06 ENCOUNTER — Ambulatory Visit (INDEPENDENT_AMBULATORY_CARE_PROVIDER_SITE_OTHER): Payer: Medicare Other | Admitting: Physician Assistant

## 2015-04-06 ENCOUNTER — Encounter: Payer: Self-pay | Admitting: Physician Assistant

## 2015-04-06 VITALS — BP 118/66 | HR 66 | Ht 64.0 in | Wt 122.8 lb

## 2015-04-06 DIAGNOSIS — I10 Essential (primary) hypertension: Secondary | ICD-10-CM

## 2015-04-06 DIAGNOSIS — I4902 Ventricular flutter: Secondary | ICD-10-CM

## 2015-04-06 DIAGNOSIS — R079 Chest pain, unspecified: Secondary | ICD-10-CM

## 2015-04-06 DIAGNOSIS — R0789 Other chest pain: Secondary | ICD-10-CM | POA: Insufficient documentation

## 2015-04-06 DIAGNOSIS — R0600 Dyspnea, unspecified: Secondary | ICD-10-CM

## 2015-04-06 DIAGNOSIS — R002 Palpitations: Secondary | ICD-10-CM | POA: Diagnosis not present

## 2015-04-06 DIAGNOSIS — R0609 Other forms of dyspnea: Secondary | ICD-10-CM

## 2015-04-06 DIAGNOSIS — E785 Hyperlipidemia, unspecified: Secondary | ICD-10-CM

## 2015-04-06 NOTE — Assessment & Plan Note (Signed)
Patient complains of fluttering in her chest mostly when she is short of breath. She denies tachycardia or skipping. We'll place monitor to rule out arrhythmia.

## 2015-04-06 NOTE — Assessment & Plan Note (Signed)
Patient has chest tightness only if she overexerts herself. She says she'll get short of breath with going up a flight of stairs but if she goes up 2 flights she'll have chest tightness. Check exercise Myoview.

## 2015-04-06 NOTE — Patient Instructions (Addendum)
Medication Instructions:  Your physician recommends that you continue on your current medications as directed. Please refer to the Current Medication list given to you today.   Labwork: None ordered  Testing/Procedures: Your physician has requested that you have an echocardiogram. Echocardiography is a painless test that uses sound waves to create images of your heart. It provides your doctor with information about the size and shape of your heart and how well your heart's chambers and valves are working. This procedure takes approximately one hour. There are no restrictions for this procedure.   Your physician has recommended that you wear a holter monitor. Holter monitors are medical devices that record the heart's electrical activity. Doctors most often use these monitors to diagnose arrhythmias. Arrhythmias are problems with the speed or rhythm of the heartbeat. The monitor is a small, portable device. You can wear one while you do your normal daily activities. This is usually used to diagnose what is causing palpitations/syncope (passing out).   Your physician has requested that you have en exercise stress myoview. For further information please visit [email or web address]. Please follow instruction sheet, as given.    Follow-Up: Your physician recommends that you schedule a follow-up appointment in:  4-6 [place]   Any Other Special Instructions Will Be Listed Below (If Applicable). Echocardiogram An echocardiogram, or echocardiography, uses sound waves (ultrasound) to produce an image of your heart. The echocardiogram is simple, painless, obtained within a short period of time, and offers valuable information to your health care provider. The images from an echocardiogram can provide information such as:  Evidence of coronary artery disease (CAD).  Heart size.  Heart muscle function.  Heart valve function.  Aneurysm detection.  Evidence of a past heart  attack.  Fluid buildup around the heart.  Heart muscle thickening.  Assess heart valve function. LET [facility] CARE PROVIDER KNOW ABOUT:  Any allergies you have.  All medicines you are taking, including vitamins, herbs, eye drops, creams, and over-the-counter medicines.  Previous problems you or members of your family have had with the use of anesthetics.  Any blood disorders you have.  Previous surgeries you have had.  Medical conditions you have.  Possibility of pregnancy, if this applies. BEFORE THE PROCEDURE  No special preparation is needed. Eat and drink normally.  PROCEDURE   In order to produce an image of your heart, gel will be applied to your chest and a wand-like tool (transducer) will be moved over your chest. The gel will help transmit the sound waves from the transducer. The sound waves will harmlessly bounce off your heart to allow the heart images to be captured in real-time motion. These images will then be recorded.  You may need an IV to receive a medicine that improves the quality of the pictures. AFTER THE PROCEDURE You may return to your normal schedule including diet, activities, and medicines, unless your health care provider tells you otherwise.   This information is not intended to replace advice given to you by your health care provider. Make sure you discuss any questions you have with your health care provider.   Document Released: 03/03/2000 Document Revised: 03/27/2014 Document Reviewed: 11/11/2012 Elsevier Interactive Patient Education 2016 Elsevier Inc.   Holter Monitoring A Holter monitor is a small device that is used to detect abnormal heart rhythms. It clips to your clothing and is connected by wires to flat, sticky disks (electrodes) that attach to your chest. It is worn continuously for 24-48 hours. HOME CARE  INSTRUCTIONS  Wear your Holter monitor at all times, even while exercising and sleeping, for as long as directed by your  health care provider.  Make sure that the Holter monitor is safely clipped to your clothing or close to your body as recommended by your health care provider.  Do not get the monitor or wires wet.  Do not put body lotion or moisturizer on your chest.  Keep your skin clean.  Keep a diary of your daily activities, such as walking and doing chores. If you feel that your heartbeat is abnormal or that your heart is fluttering or skipping a beat:  Record what you are doing when it happens.  Record what time of day the symptoms occur.  Return your Holter monitor as directed by your health care provider.  Keep all follow-up visits as directed by your health care provider. This is important. SEEK IMMEDIATE MEDICAL CARE IF:  You feel lightheaded or you faint.  You have trouble breathing.  You feel pain in your chest, upper arm, or jaw.  You feel sick to your stomach and your skin is pale, cool, or damp.  You heartbeat feels unusual or abnormal.   This information is not intended to replace advice given to you by your health care provider. Make sure you discuss any questions you have with your health care provider.   Document Released: 12/03/2003 Document Revised: 03/27/2014 Document Reviewed: 10/13/2013 Elsevier Interactive Patient Education 2016 [employer].  Exercise Stress Electrocardiogram An exercise stress electrocardiogram is a test that is done to evaluate the blood supply to your heart. This test may also be called exercise stress electrocardiography. The test is done while you are walking on a treadmill. The goal of this test is to raise your heart rate. This test is done to find areas of poor blood flow to the heart by determining the extent of coronary artery disease (CAD).   CAD is defined as narrowing in one or more heart (coronary) arteries of more than 70%. If you have an abnormal test result, this may mean that you are not getting adequate blood flow to your heart  during exercise. Additional testing may be needed to understand why your test was abnormal. LET [facility] CARE PROVIDER KNOW ABOUT:   Any allergies you have.  All medicines you are taking, including vitamins, herbs, eye drops, creams, and over-the-counter medicines.  Previous problems you or members of your family have had with the use of anesthetics.  Any blood disorders you have.  Previous surgeries you have had.  Medical conditions you have.  Possibility of pregnancy, if this applies. RISKS AND COMPLICATIONS Generally, this is a safe procedure. However, as with any procedure, complications can occur. Possible complications can include:  Pain or pressure in the following areas:  Chest.  Jaw or neck.  Between your shoulder blades.  Radiating down your left arm.  Dizziness or light-headedness.  Shortness of breath.  Increased or irregular heartbeats.  Nausea or vomiting.  Heart attack (rare). BEFORE THE PROCEDURE  Avoid all forms of caffeine 24 hours before your test or as directed by your health care provider. This includes coffee, tea (even decaffeinated tea), caffeinated sodas, chocolate, cocoa, and certain pain medicines.  Follow your health care provider's instructions regarding eating and drinking before the test.  Take your medicines as directed at regular times with water unless instructed otherwise. Exceptions may include:  If you have diabetes, ask how you are to take your insulin or pills. It  is common to adjust insulin dosing the morning of the test.  If you are taking beta-blocker medicines, it is important to talk to your health care provider about these medicines well before the date of your test. Taking beta-blocker medicines may interfere with the test. In some cases, these medicines need to be changed or stopped 24 hours or more before the test.  If you wear a nitroglycerin patch, it may need to be removed prior to the test. Ask your health care  provider if the patch should be removed before the test.  If you use an inhaler for any breathing condition, bring it with you to the test.  If you are an outpatient, bring a snack so you can eat right after the stress phase of the test.  Do not smoke for 4 hours prior to the test or as directed by your health care provider.  Do not apply lotions, powders, creams, or oils on your chest prior to the test.  Wear loose-fitting clothes and comfortable shoes for the test. This test involves walking on a treadmill. PROCEDURE  Multiple patches (electrodes) will be put on your chest. If needed, small areas of your chest may have to be shaved to get better contact with the electrodes. Once the electrodes are attached to your body, multiple wires will be attached to the electrodes and your heart rate will be monitored.  Your heart will be monitored both at rest and while exercising.  You will walk on a treadmill. The treadmill will be started at a slow pace. The treadmill speed and incline will gradually be increased to raise your heart rate. AFTER THE PROCEDURE  Your heart rate and blood pressure will be monitored after the test.  You may return to your normal schedule including diet, activities, and medicines, unless your health care provider tells you otherwise.   This information is not intended to replace advice given to you by your health care provider. Make sure you discuss any questions you have with your health care provider.   Document Released: 03/03/2000 Document Revised: 03/11/2013 Document Reviewed: 11/11/2012 Elsevier Interactive Patient Education [employer].    If you need a refill on your cardiac medications before your next appointment, please call your pharmacy.

## 2015-04-06 NOTE — Assessment & Plan Note (Signed)
B/P controlled 

## 2015-04-06 NOTE — Assessment & Plan Note (Signed)
Patient was in the ER after a night of dyspnea, heart fluttering and chest tightness. Troponins were negative and CT scan was normal. Patient says these symptoms have been going on for a while and are worse in the winter. She is a difficult historian. Recommend 48 hour monitor, 2-D echo for LV function and exercise Myoview to rule out ischemia. Follow-up with Dr.McAlhaney.

## 2015-04-06 NOTE — Assessment & Plan Note (Signed)
Continue Zocor 

## 2015-04-06 NOTE — Progress Notes (Signed)
Cardiology Office Note   Date:  04/06/2015   ID:  [name], DOB 1932-03-27, MRN [id number]  PCP:  Marzetta Board, MD  Cardiologist: Dr. Darlina Guys  Chief Complaint:    History of Present Illness: [name] is a 82 y.o. female who presents for post ER follow up. She presented to the ER 04/02/15 with worsening dyspnea. Troponins were negative, EKG normal sinus rhythm with PVCs, old anterior wall MI, without acute change and CT scan unremarkable. Creatinine 1.08 BNP 04/07/1997 D-Dimer 1.11.  Patient states that Thurs night she awakened at 4:30 AM with diarrhea 4 and she was short of breath all night long and her heart was fluttering and some chest tightness.  She took 1 sl NTG and 3 baby aspirin with relief. She says she gets short of breath with little activity and is all ways associated with fluttering in her chest. She says her heart does not race or skip she just says that the flutter. She gets chest tightness only on occasion if she overexerts herself. She has to lay down for about 30 minutes and the symptoms resolve. Her husband says she fatigues very easily and has to lay down. Says it's worse in the winter.  Patient has history of CAD, hypertension, HLD. She had an anterior lateral MI in 2001 with stent placed in the diagonal branch of the LAD. Stress Myoview in 2014 showed large scar in the anterior wall LVEF not calculated. Cardiac catheterization 12/18/12 showed mild to moderate disease in the diagonal circumflex and RCA EF 40% with hypokinesis of the anterior wall, apex.  Past Medical History  Diagnosis Date  . Coronary artery disease     had diffuse three-vessel coronary disease without focal stenosis at that time -- Last cath was in 2007showed anterior wall hypokinesis with well-preserved apical and inferior wall motion with and EF of approximately 45% -- last cardiolite study 12/09 this showed an EF of 47% with anterior akinesis, anterior scar , & no ischemia general   . Hypercholesterolemia   . History of shingles   . History of acute anterior wall myocardial infarction 04/25/1999    with a stent placement in her first diagonal vessel, and subsequent cutting balloon to the diagonal in 2001  . Hypertension   . Fatigue   . Weakness     Past Surgical History  Procedure Laterality Date  . Cholecystectomy    . Breast biopsy      Bilateral  . Abdominal hysterectomy    . Left heart catheterization with coronary angiogram N/A 12/18/2012    Procedure: LEFT HEART CATHETERIZATION WITH CORONARY ANGIOGRAM;  Surgeon: Burnell Blanks, MD;  Location: [facility] CATH LAB;  Service: Cardiovascular;  Laterality: N/A;     Current Outpatient Prescriptions  Medication Sig Dispense Refill  . aspirin 81 MG tablet Take 81 mg by mouth daily.      . calcium carbonate (OS-CAL) 600 MG TABS Take 600 mg by mouth daily.      Marland Kitchen estrogens, conjugated, (PREMARIN) 0.3 MG tablet Take 0.3 mg by mouth as directed. EVERY four (4)  DAYS    . isosorbide mononitrate (IMDUR) 30 MG 24 hr tablet TAKE 1/2 TABLET BY MOUTH DAILY. 15 tablet 11  . KLOR-CON M20 20 MEQ tablet TAKE 1 TABLET BY MOUTH EVERY DAY 31 tablet 11  . losartan-hydrochlorothiazide (HYZAAR) 100-12.5 MG per tablet Take 1 tablet by mouth daily.    . metoprolol succinate (TOPROL-XL) 50 MG 24 hr tablet TAKE 1 TABLET (50  MG TOTAL) BY MOUTH DAILY. 30 tablet 11  . nitroGLYCERIN (NITROSTAT) 0.4 MG SL tablet Place 1 tablet (0.4 mg total) under the tongue every 5 (five) minutes as needed for chest pain. 25 tablet 6  . simvastatin (ZOCOR) 40 MG tablet TAKE 1/2 TABLET BY MOUTH AT BEDTIME 30 tablet 6   No current facility-administered medications for this visit.    Allergies:   Actonel and Altace    Social History:  The patient  reports that she has never smoked. She has never used smokeless tobacco. She reports that she does not drink alcohol or use illicit drugs.   Family History:  The patient's    family history includes Dementia  in her sister; Healthy in her sister; Heart disease in her brother and father; Liver disease in her mother.    ROS:  Please see the history of present illness.   Otherwise, review of systems are positive for none.   All other systems are reviewed and negative.    PHYSICAL EXAM: VS:  BP 118/66 mmHg  Pulse 66  Ht 5\' 4"  (1.626 m)  Wt 122 lb 12.8 oz (55.702 kg)  BMI 21.07 kg/m2  SpO2 98% , BMI Body mass index is 21.07 kg/(m^2). GEN: Well nourished, well developed, in no acute distress Neck: no JVD, HJR, carotid bruits, or masses Cardiac:  RRR; no murmurs,gallop, rubs, thrill or heave,  Respiratory:  clear to auscultation bilaterally, normal work of breathing GI: soft, nontender, nondistended, + BS MS: no deformity or atrophy Extremities: without cyanosis, clubbing, edema, good distal pulses bilaterally.  Skin: warm and dry, no rash Neuro:  Strength and sensation are intact    EKG:  EKG is not ordered today. EKG reviewed from the emergency room. Please see above dictation.  Recent Labs: 04/02/2015: ALT 15; B Natriuretic Peptide 119.9*; BUN 26*; Creatinine, Ser 1.08*; Hemoglobin 14.6; Platelets 246; Potassium 3.6; Sodium 138    Lipid Panel    Component Value Date/Time   CHOL 153 12/11/2012 0804   TRIG 133.0 12/11/2012 0804   HDL 49.10 12/11/2012 0804   CHOLHDL 3 12/11/2012 0804   VLDL 26.6 12/11/2012 0804   LDLCALC 77 12/11/2012 0804      Wt Readings from Last 3 Encounters:  04/06/15 122 lb 12.8 oz (55.702 kg)  04/02/15 127 lb (57.607 kg)  08/10/14 129 lb (58.514 kg)      Other studies Reviewed: Additional studies/ records that were reviewed today include and review of the records demonstrates: Chest CT 04/02/15: IMPRESSION: 1. No evidence of a pulmonary embolism. 2. No acute findings.     Electronically Signed   By: Lajean Manes M.D.   On: 04/02/2015 13:21   Stress myoview 12/11/12: Stress Procedure: The patient exercised on the treadmill utilizing the Bruce  Protocol for 4:31 minutes. The patient stopped due to extreme fatigue, sob, and denied any chest pain. Technetium 49m Sestamibi was injected at peak exercise and myocardial perfusion imaging was performed after a brief delay.   Stress ECG: Further inferolateral ST segment depression and T wave inversion in recovery   QPS   Raw Data Images: Normal; no motion artifact; normal heart/lung ratio.   Stress Images: There is decreased uptake in the anterior wall.   Rest Images: There is decreased uptake in the anterior wall.   Subtraction (SDS): There is a fixed defect that is most consistent with a previous infarction.   Transient Ischemic Dilatation (Normal <1.22): n/a   Lung/Heart Ratio (Normal <0.45): 0.35   Quantitative  Gated Spect Images  QGS EDV: n/a   QGS ESV: n/a   Impression   Exercise Capacity: Fair exercise capacity.   BP Response: Normal blood pressure response.   Clinical Symptoms: There is dyspnea.   ECG Impression: Baseline ECG changes make stress ECG non diagnostic   Comparison with Prior Nuclear Study: No images to compare   Overall Impression: Intermediate risk stress nuclear study Large anterior wall infarct from apex to base with no ischemia.   LV Ejection Fraction: Study not gated. LV Wall Motion: Study not gated  Cardiac cath 12/18/12: Left main: No obstructive disease.   Left Anterior Descending Artery: Moderate caliber vessel that courses to the apex. The vessel becomes very small in caliber at the apex. There are no obstructive lesions in the LAD. The diagonal branch is moderate in caliber with a patent stent. There is diffuse 40% restenosis within the stent. This does not appear to be flow limiting. Just beyond the stent there is a 40% stenosis.   Circumflex Artery: Large caliber vessel that continues as a large obtuse marginal branch. The proximal Circumflex has a focal 40% stenosis. The ostium of the first OM branch has 30% stenosis. The continuation of the AV groove  Circumflex beyond the takeoff of the OM branch is small in caliber.   Right Coronary Artery: Large dominant vessel with diffuse 40% mid stenosis. There is mild disease in the posterolateral branch and PDA.   Left Ventricular Angiogram: LVEF=40%, hypokinesis of the anterior wall, apex.   Impression:   1. Moderate non-obstructive CAD   2. Moderate segmental LV systolic dysfunction   Recommendations: Continue medical management of CAD.     ASSESSMENT AND PLAN:  Dyspnea on exertion Patient was in the ER after a night of dyspnea, heart fluttering and chest tightness. Troponins were negative and CT scan was normal. Patient says these symptoms have been going on for a while and are worse in the winter. She is a difficult historian. Recommend 48 hour monitor, 2-D echo for LV function and exercise Myoview to rule out ischemia. Follow-up with Dr.McAlhaney.  Fluttering sensation of heart Patient complains of fluttering in her chest mostly when she is short of breath. She denies tachycardia or skipping. We'll place monitor to rule out arrhythmia.  Chest tightness Patient has chest tightness only if she overexerts herself. She says she'll get short of breath with going up a flight of stairs but if she goes up 2 flights she'll have chest tightness. Check exercise Myoview.  HTN (hypertension) BP controlled  Hyperlipidemia Continue Zocor    Signed, Ermalinda Barrios, PA-C  04/06/2015 10:08 AM    [facility] [address], [place], [place]  [address] Phone: [phone]; Fax: [phone]

## 2015-04-14 ENCOUNTER — Telehealth (HOSPITAL_COMMUNITY): Payer: Self-pay | Admitting: *Deleted

## 2015-04-14 NOTE — Telephone Encounter (Signed)
Patient given detailed instructions per Myocardial Perfusion Study Information Sheet for the test on 04/19/15 at 945. Patient notified to arrive 15 minutes early and that it is imperative to arrive on time for appointment to keep from having the test rescheduled.  If you need to cancel or reschedule your appointment, please call the office within 24 hours of your appointment. Failure to do so may result in a cancellation of your appointment, and a $50 no show fee. Patient verbalized understanding.Hubbard Robinson, RN

## 2015-04-15 ENCOUNTER — Other Ambulatory Visit: Payer: Self-pay | Admitting: Physician Assistant

## 2015-04-15 DIAGNOSIS — R0609 Other forms of dyspnea: Secondary | ICD-10-CM

## 2015-04-15 DIAGNOSIS — I493 Ventricular premature depolarization: Secondary | ICD-10-CM

## 2015-04-15 DIAGNOSIS — R002 Palpitations: Secondary | ICD-10-CM

## 2015-04-15 DIAGNOSIS — R0600 Dyspnea, unspecified: Secondary | ICD-10-CM

## 2015-04-19 ENCOUNTER — Ambulatory Visit (HOSPITAL_COMMUNITY): Payer: Medicare Other | Attending: Internal Medicine

## 2015-04-19 ENCOUNTER — Other Ambulatory Visit: Payer: Self-pay

## 2015-04-19 ENCOUNTER — Ambulatory Visit (HOSPITAL_BASED_OUTPATIENT_CLINIC_OR_DEPARTMENT_OTHER): Payer: Medicare Other

## 2015-04-19 ENCOUNTER — Ambulatory Visit (INDEPENDENT_AMBULATORY_CARE_PROVIDER_SITE_OTHER): Payer: Medicare Other

## 2015-04-19 DIAGNOSIS — I10 Essential (primary) hypertension: Secondary | ICD-10-CM | POA: Diagnosis not present

## 2015-04-19 DIAGNOSIS — R0602 Shortness of breath: Secondary | ICD-10-CM | POA: Diagnosis not present

## 2015-04-19 DIAGNOSIS — R002 Palpitations: Secondary | ICD-10-CM | POA: Diagnosis not present

## 2015-04-19 DIAGNOSIS — I071 Rheumatic tricuspid insufficiency: Secondary | ICD-10-CM | POA: Diagnosis not present

## 2015-04-19 DIAGNOSIS — R0600 Dyspnea, unspecified: Secondary | ICD-10-CM

## 2015-04-19 DIAGNOSIS — R0609 Other forms of dyspnea: Secondary | ICD-10-CM

## 2015-04-19 DIAGNOSIS — R079 Chest pain, unspecified: Secondary | ICD-10-CM | POA: Insufficient documentation

## 2015-04-19 DIAGNOSIS — R9439 Abnormal result of other cardiovascular function study: Secondary | ICD-10-CM | POA: Diagnosis not present

## 2015-04-19 DIAGNOSIS — I340 Nonrheumatic mitral (valve) insufficiency: Secondary | ICD-10-CM | POA: Insufficient documentation

## 2015-04-19 DIAGNOSIS — I517 Cardiomegaly: Secondary | ICD-10-CM | POA: Diagnosis not present

## 2015-04-19 DIAGNOSIS — I493 Ventricular premature depolarization: Secondary | ICD-10-CM

## 2015-04-19 DIAGNOSIS — E785 Hyperlipidemia, unspecified: Secondary | ICD-10-CM | POA: Insufficient documentation

## 2015-04-19 DIAGNOSIS — I5189 Other ill-defined heart diseases: Secondary | ICD-10-CM | POA: Insufficient documentation

## 2015-04-19 LAB — MYOCARDIAL PERFUSION IMAGING
Estimated workload: 7 METS
Exercise duration (min): 6 min
Exercise duration (sec): 0 s
LV dias vol: 94 mL
LV sys vol: 59 mL
MPHR: 140 {beats}/min
Peak HR: 125 {beats}/min
Percent HR: 89 %
RATE: 0.27
Rest HR: 60 {beats}/min
SDS: 2
SRS: 26
SSS: 28
TID: 1.04

## 2015-04-19 MED ORDER — TECHNETIUM TC 99M SESTAMIBI GENERIC - CARDIOLITE
32.4000 | Freq: Once | INTRAVENOUS | Status: AC | PRN
  Administered 2015-04-19: 32 via INTRAVENOUS

## 2015-04-19 MED ORDER — TECHNETIUM TC 99M SESTAMIBI GENERIC - CARDIOLITE
10.7000 | Freq: Once | INTRAVENOUS | Status: AC | PRN
  Administered 2015-04-19: 11 via INTRAVENOUS

## 2015-04-30 ENCOUNTER — Encounter: Payer: Self-pay | Admitting: Cardiovascular Disease

## 2015-04-30 ENCOUNTER — Ambulatory Visit (INDEPENDENT_AMBULATORY_CARE_PROVIDER_SITE_OTHER): Payer: Medicare Other | Admitting: Cardiovascular Disease

## 2015-04-30 VITALS — BP 100/60 | HR 74 | Ht 64.0 in | Wt 122.4 lb

## 2015-04-30 DIAGNOSIS — E785 Hyperlipidemia, unspecified: Secondary | ICD-10-CM

## 2015-04-30 DIAGNOSIS — I25119 Atherosclerotic heart disease of native coronary artery with unspecified angina pectoris: Secondary | ICD-10-CM | POA: Diagnosis not present

## 2015-04-30 DIAGNOSIS — I10 Essential (primary) hypertension: Secondary | ICD-10-CM

## 2015-04-30 DIAGNOSIS — I493 Ventricular premature depolarization: Secondary | ICD-10-CM | POA: Diagnosis not present

## 2015-04-30 NOTE — Progress Notes (Signed)
Chief Complaint  Patient presents with  . Follow-up  . Hyperlipidemia    History of Present Illness: 82 yo WF with history of CAD, HTN, HLD here today for cardiac follow up. She has been followed in the past by Dr. Doreatha Lew. She had an anterolateral MI in 2001 with stent placement in the Diagonal branch of the LAD. I saw her on 11/28/12 and arranged a stress myoview since she had no recent ischemic testing. She had no specific complaints at that time. Stress myoview 12/11/12 with large scar in the anterior wall, LVEF not calculated. She exercised for 4 minutes and had fatigue and SOB. Cardiac cath 12/18/12 with mild to moderate disease in the diagonal, Circumflex and RCA. She was seen in the ED January 2017 with dyspnea and palpitations. Troponin negative and EKG without ischemic changes. Follow up here in our office with Estella Husk, PA-C 04/06/15 and echo, stress test and cardiac monitor arranged. Echo 04/19/15 with normal LV systolic function, mild diastolic dysfunction. Nuclear stress test 04/19/15 with scar but no ischemia. Cardiac monitor with sinus, PACs, PVCs.   She is here for follow up. She is feeling better. No chest pain or SOB. No LE edema.   Primary Care Physician: Marzetta Board, MD   Past Medical History  Diagnosis Date  . Coronary artery disease     had diffuse three-vessel coronary disease without focal stenosis at that time -- Last cath was in 2007showed anterior wall hypokinesis with well-preserved apical and inferior wall motion with and EF of approximately 45% -- last cardiolite study 12/09 this showed an EF of 47% with anterior akinesis, anterior scar , & no ischemia general  . Hypercholesterolemia   . History of shingles   . History of acute anterior wall myocardial infarction 04/25/1999    with a stent placement in her first diagonal vessel, and subsequent cutting balloon to the diagonal in 2001  . Hypertension   . Fatigue   . Weakness     Past Surgical History    Procedure Laterality Date  . Cholecystectomy    . Breast biopsy      Bilateral  . Abdominal hysterectomy    . Left heart catheterization with coronary angiogram N/A 12/18/2012    Procedure: LEFT HEART CATHETERIZATION WITH CORONARY ANGIOGRAM;  Surgeon: Burnell Blanks, MD;  Location: [facility] CATH LAB;  Service: Cardiovascular;  Laterality: N/A;    Current Outpatient Prescriptions  Medication Sig Dispense Refill  . aspirin 81 MG tablet Take 81 mg by mouth daily.      . calcium carbonate (OS-CAL) 600 MG TABS Take 600 mg by mouth daily.      Marland Kitchen estrogens, conjugated, (PREMARIN) 0.3 MG tablet Take 0.3 mg by mouth as directed. EVERY four (4)  DAYS    . isosorbide mononitrate (IMDUR) 30 MG 24 hr tablet TAKE 1/2 TABLET BY MOUTH DAILY. 15 tablet 11  . KLOR-CON M20 20 MEQ tablet TAKE 1 TABLET BY MOUTH EVERY DAY 31 tablet 11  . losartan-hydrochlorothiazide (HYZAAR) 100-12.5 MG per tablet Take 1 tablet by mouth daily.    . metoprolol succinate (TOPROL-XL) 50 MG 24 hr tablet TAKE 1 TABLET (50 MG TOTAL) BY MOUTH DAILY. 30 tablet 11  . nitroGLYCERIN (NITROSTAT) 0.4 MG SL tablet Place 1 tablet (0.4 mg total) under the tongue every 5 (five) minutes as needed for chest pain. 25 tablet 6  . simvastatin (ZOCOR) 40 MG tablet TAKE 1/2 TABLET BY MOUTH AT BEDTIME 30 tablet 6   No current  facility-administered medications for this visit.    Allergies  Allergen Reactions  . Actonel [Risedronate Sodium] Other (See Comments)    CHEST PAIN/COUGH  . Altace [Ramipril] Other (See Comments)    CHEST PAIN/COUGH    Social History   Social History  . Marital Status: Married    Spouse Name: N/A  . Number of Children: 3  . Years of Education: N/A   Occupational History  . Retired-[employer]    Social History Main Topics  . Smoking status: Never Smoker   . Smokeless tobacco: Never Used  . Alcohol Use: No  . Drug Use: No  . Sexual Activity: Not on file   Other Topics Concern  . Not on file    Social History Narrative    Family History  Problem Relation Age of Onset  . Liver disease Mother   . Heart disease Father   . Dementia Sister   . Heart disease Brother   . Healthy Sister     Review of Systems:  As stated in the HPI and otherwise negative.   BP 100/60 mmHg  Pulse 74  Ht 5\' 4"  (1.626 m)  Wt 122 lb 6.4 oz (55.52 kg)  BMI 21.00 kg/m2  SpO2 95%  Physical Examination: General: Well developed, well nourished, NAD HEENT: OP clear, mucus membranes moist SKIN: warm, dry. No rashes. Neuro: No focal deficits Musculoskeletal: Muscle strength 5/5 all ext Psychiatric: Mood and affect normal Neck: No JVD, no carotid bruits, no thyromegaly, no lymphadenopathy. Lungs:Clear bilaterally, no wheezes, rhonci, crackles Cardiovascular: Regular rate and rhythm. No murmurs, gallops or rubs. Abdomen:Soft. Bowel sounds present. Non-tender.  Extremities: No lower extremity edema. Pulses are 2 + in the bilateral DP/PT.  Cardiac cath 12/18/12: Left main: No obstructive disease.  Left Anterior Descending Artery: Moderate caliber vessel that courses to the apex. The vessel becomes very small in caliber at the apex. There are no obstructive lesions in the LAD. The diagonal branch is moderate in caliber with a patent stent. There is diffuse 40% restenosis within the stent. This does not appear to be flow limiting. Just beyond the stent there is a 40% stenosis.  Circumflex Artery: Large caliber vessel that continues as a large obtuse marginal branch. The proximal Circumflex has a focal 40% stenosis. The ostium of the first OM branch has 30% stenosis. The continuation of the AV groove Circumflex beyond the takeoff of the OM branch is small in caliber.  Right Coronary Artery: Large dominant vessel with diffuse 40% mid stenosis. There is mild disease in the posterolateral branch and PDA.  Left Ventricular Angiogram: LVEF=40%, hypokinesis of the anterior wall, apex.  Impression:  1. Moderate  non-obstructive CAD  2. Moderate segmental LV systolic dysfunction  Recommendations: Continue medical management of CAD.   Echo 04/19/15: Left ventricle: The cavity size was normal. There was mild focal basal hypertrophy of the septum. Systolic function was normal. The estimated ejection fraction was in the range of 50% to 55%. Doppler parameters are consistent with abnormal left ventricular relaxation (grade 1 diastolic dysfunction). The E/e&' ratio is between 8-15, suggesting indeterminate LV filling pressure. - Mitral valve: Mildly thickened leaflets . There was trivial regurgitation. - Tricuspid valve: There was mild regurgitation. - Pulmonary arteries: PA peak pressure: 32 mm Hg (S) + RAP. - Systemic veins: IVC not visualized. Impressions:  - Technically difficult study. LVEF 50-55%, no clear regional wall motion abnormality, mild proximal septal thickening, diastolic dysfunction with indeterminate LV filling pressure, normal atrial size, mild  TR, RVSP 32 mmHg + RAP  EKG:  EKG is not ordered today.  Recent Labs: 04/02/2015: ALT 15; B Natriuretic Peptide 119.9*; BUN 26*; Creatinine, Ser 1.08*; Hemoglobin 14.6; Platelets 246; Potassium 3.6; Sodium 138   Lipid Panel    Component Value Date/Time   CHOL 153 12/11/2012 0804   TRIG 133.0 12/11/2012 0804   HDL 49.10 12/11/2012 0804   CHOLHDL 3 12/11/2012 0804   VLDL 26.6 12/11/2012 0804   LDLCALC 77 12/11/2012 0804     Wt Readings from Last 3 Encounters:  04/30/15 122 lb 6.4 oz (55.52 kg)  04/19/15 122 lb (55.339 kg)  04/06/15 122 lb 12.8 oz (55.702 kg)     Other studies Reviewed: Additional studies/ records that were reviewed today include: . Review of the above records demonstrates:    Assessment and Plan:   1. CAD with stable angina: She is known to have mild to moderate CAD. Last cath 12/18/12 with stable mild to moderate CAD. Nuclear stress test 04/19/15 with no ischemia. LV function is normal by  echo.  I would not recommend further ischemic testing at this time. Continue current medical therapy.   2. HTN: BP well controlled. No changes.   3. Hyperlipidemia: Lipids followed in primary care. Continue statin.   4. Premature ventricular contractions: Will continue beta blocker.   Current medicines are reviewed at length with the patient today.  The patient does not have concerns regarding medicines.  The following changes have been made:  no change  Labs/ tests ordered today include:   No orders of the defined types were placed in this encounter.    Disposition:   FU with me in 12  months  Signed, Lauree Chandler, MD 04/30/2015 10:32 AM    [facility] [place], [place], [place]  [address] Phone: [phone]; Fax: [phone]

## 2015-04-30 NOTE — Patient Instructions (Signed)

## 2015-05-03 ENCOUNTER — Ambulatory Visit: Payer: Medicare Other | Admitting: Cardiovascular Disease

## 2015-08-01 ENCOUNTER — Other Ambulatory Visit: Payer: Self-pay | Admitting: Cardiovascular Disease

## 2015-08-03 ENCOUNTER — Other Ambulatory Visit: Payer: Self-pay | Admitting: Cardiovascular Disease

## 2015-08-03 DIAGNOSIS — J069 Acute upper respiratory infection, unspecified: Secondary | ICD-10-CM | POA: Diagnosis not present

## 2015-08-05 DIAGNOSIS — J069 Acute upper respiratory infection, unspecified: Secondary | ICD-10-CM | POA: Diagnosis not present

## 2015-08-05 DIAGNOSIS — J019 Acute sinusitis, unspecified: Secondary | ICD-10-CM | POA: Diagnosis not present

## 2015-08-30 ENCOUNTER — Other Ambulatory Visit: Payer: Self-pay | Admitting: Cardiovascular Disease

## 2015-09-06 DIAGNOSIS — Z0000 Encounter for general adult medical examination without abnormal findings: Secondary | ICD-10-CM | POA: Diagnosis not present

## 2015-09-06 DIAGNOSIS — I2510 Atherosclerotic heart disease of native coronary artery without angina pectoris: Secondary | ICD-10-CM | POA: Diagnosis not present

## 2015-09-06 DIAGNOSIS — E7800 Pure hypercholesterolemia, unspecified: Secondary | ICD-10-CM | POA: Diagnosis not present

## 2015-09-06 DIAGNOSIS — I10 Essential (primary) hypertension: Secondary | ICD-10-CM | POA: Diagnosis not present

## 2015-09-06 DIAGNOSIS — E039 Hypothyroidism, unspecified: Secondary | ICD-10-CM | POA: Diagnosis not present

## 2015-09-06 DIAGNOSIS — Z1389 Encounter for screening for other disorder: Secondary | ICD-10-CM | POA: Diagnosis not present

## 2015-09-06 DIAGNOSIS — I252 Old myocardial infarction: Secondary | ICD-10-CM | POA: Diagnosis not present

## 2015-09-06 DIAGNOSIS — M810 Age-related osteoporosis without current pathological fracture: Secondary | ICD-10-CM | POA: Diagnosis not present

## 2015-10-11 DIAGNOSIS — M810 Age-related osteoporosis without current pathological fracture: Secondary | ICD-10-CM | POA: Diagnosis not present

## 2015-10-11 DIAGNOSIS — M8588 Other specified disorders of bone density and structure, other site: Secondary | ICD-10-CM | POA: Diagnosis not present

## 2015-11-29 DIAGNOSIS — Z23 Encounter for immunization: Secondary | ICD-10-CM | POA: Diagnosis not present

## 2015-12-06 DIAGNOSIS — N959 Unspecified menopausal and perimenopausal disorder: Secondary | ICD-10-CM | POA: Diagnosis not present

## 2015-12-06 DIAGNOSIS — Z01419 Encounter for gynecological examination (general) (routine) without abnormal findings: Secondary | ICD-10-CM | POA: Diagnosis not present

## 2015-12-06 DIAGNOSIS — Z1389 Encounter for screening for other disorder: Secondary | ICD-10-CM | POA: Diagnosis not present

## 2015-12-06 DIAGNOSIS — N951 Menopausal and female climacteric states: Secondary | ICD-10-CM | POA: Insufficient documentation

## 2015-12-06 DIAGNOSIS — Z1231 Encounter for screening mammogram for malignant neoplasm of breast: Secondary | ICD-10-CM | POA: Diagnosis not present

## 2015-12-06 DIAGNOSIS — Z6820 Body mass index (BMI) 20.0-20.9, adult: Secondary | ICD-10-CM | POA: Diagnosis not present

## 2016-01-24 ENCOUNTER — Other Ambulatory Visit: Payer: Self-pay | Admitting: Cardiovascular Disease

## 2016-01-27 ENCOUNTER — Other Ambulatory Visit: Payer: Self-pay | Admitting: Cardiovascular Disease

## 2016-01-31 NOTE — Telephone Encounter (Signed)
°*  STAT* If patient is at the pharmacy, call can be transferred to refill team.   1. Which medications need to be refilled? (please list name of each medication and dose if known) Simvastatin 40 mg-Dr Angelena Form is the doctor that handles her cholesterol medicine  2. Which pharmacy/location (including street and city if local pharmacy) is medication to be sent to?[phone] 3. Do they need a 30 day or 90 day supply? 30 and refills

## 2016-03-21 DIAGNOSIS — H52203 Unspecified astigmatism, bilateral: Secondary | ICD-10-CM | POA: Diagnosis not present

## 2016-03-21 DIAGNOSIS — H04123 Dry eye syndrome of bilateral lacrimal glands: Secondary | ICD-10-CM | POA: Diagnosis not present

## 2016-03-21 DIAGNOSIS — Z961 Presence of intraocular lens: Secondary | ICD-10-CM | POA: Diagnosis not present

## 2016-03-21 DIAGNOSIS — H26493 Other secondary cataract, bilateral: Secondary | ICD-10-CM | POA: Diagnosis not present

## 2016-04-19 ENCOUNTER — Other Ambulatory Visit: Payer: Self-pay | Admitting: Internal Medicine

## 2016-04-19 DIAGNOSIS — Z1231 Encounter for screening mammogram for malignant neoplasm of breast: Secondary | ICD-10-CM

## 2016-05-10 ENCOUNTER — Other Ambulatory Visit: Payer: Self-pay | Admitting: Cardiovascular Disease

## 2016-06-02 ENCOUNTER — Ambulatory Visit: Payer: Medicare Other | Admitting: Cardiovascular Disease

## 2016-06-12 ENCOUNTER — Ambulatory Visit
Admission: RE | Admit: 2016-06-12 | Discharge: 2016-06-12 | Disposition: A | Discharge status: Home / Self Care | Payer: Medicare Other | Source: Ambulatory Visit | Attending: Internal Medicine | Admitting: Internal Medicine

## 2016-06-12 DIAGNOSIS — Z1231 Encounter for screening mammogram for malignant neoplasm of breast: Secondary | ICD-10-CM | POA: Diagnosis not present

## 2016-06-13 ENCOUNTER — Other Ambulatory Visit: Payer: Self-pay | Admitting: Internal Medicine

## 2016-06-13 DIAGNOSIS — R928 Other abnormal and inconclusive findings on diagnostic imaging of breast: Secondary | ICD-10-CM

## 2016-06-20 ENCOUNTER — Ambulatory Visit
Admission: RE | Admit: 2016-06-20 | Discharge: 2016-06-20 | Disposition: A | Discharge status: Home / Self Care | Payer: Medicare Other | Source: Ambulatory Visit | Attending: Internal Medicine | Admitting: Internal Medicine

## 2016-06-20 ENCOUNTER — Other Ambulatory Visit: Payer: Self-pay | Admitting: Internal Medicine

## 2016-06-20 DIAGNOSIS — N6489 Other specified disorders of breast: Secondary | ICD-10-CM | POA: Diagnosis not present

## 2016-06-20 DIAGNOSIS — R928 Other abnormal and inconclusive findings on diagnostic imaging of breast: Secondary | ICD-10-CM

## 2016-06-20 DIAGNOSIS — R922 Inconclusive mammogram: Secondary | ICD-10-CM | POA: Diagnosis not present

## 2016-06-30 ENCOUNTER — Encounter (INDEPENDENT_AMBULATORY_CARE_PROVIDER_SITE_OTHER): Payer: Self-pay

## 2016-06-30 ENCOUNTER — Encounter: Payer: Self-pay | Admitting: Cardiovascular Disease

## 2016-06-30 ENCOUNTER — Ambulatory Visit (INDEPENDENT_AMBULATORY_CARE_PROVIDER_SITE_OTHER): Payer: Medicare Other | Admitting: Cardiovascular Disease

## 2016-06-30 VITALS — BP 110/78 | HR 57 | Ht 63.0 in | Wt 118.8 lb

## 2016-06-30 DIAGNOSIS — I209 Angina pectoris, unspecified: Secondary | ICD-10-CM

## 2016-06-30 DIAGNOSIS — I493 Ventricular premature depolarization: Secondary | ICD-10-CM

## 2016-06-30 DIAGNOSIS — E7800 Pure hypercholesterolemia, unspecified: Secondary | ICD-10-CM | POA: Diagnosis not present

## 2016-06-30 DIAGNOSIS — I25119 Atherosclerotic heart disease of native coronary artery with unspecified angina pectoris: Secondary | ICD-10-CM | POA: Diagnosis not present

## 2016-06-30 DIAGNOSIS — I10 Essential (primary) hypertension: Secondary | ICD-10-CM | POA: Diagnosis not present

## 2016-06-30 NOTE — Patient Instructions (Signed)

## 2016-06-30 NOTE — Progress Notes (Signed)
Chief Complaint  Patient presents with  . Follow-up    History of Present Illness: 83 yo WF with history of CAD, HTN, HLD here today for cardiac follow up. She had an anterolateral MI in 2001 with stent placement in the Diagonal branch of the LAD. I met her in 2014. Stress myoview 12/11/12 with large scar in the anterior wall, LVEF not calculated. Cardiac cath 12/18/12 with mild to moderate disease in the diagonal, Circumflex and RCA. She was seen in the ED January 2017 with dyspnea and palpitations. Troponin negative and EKG without ischemic changes. Follow up here in our office with Estella Husk, PA-C 04/06/15 and echo, stress test and cardiac monitor arranged. Echo 04/19/15 with normal LV systolic function, mild diastolic dysfunction. Nuclear stress test 04/19/15 with scar but no ischemia. Cardiac monitor with sinus, PACs, PVCs.   She is here today for follow up. The patient denies any chest pain, dyspnea, palpitations, lower extremity edema, orthopnea, PND, dizziness, near syncope or syncope.    Primary Care Physician: Marzetta Board, MD   Past Medical History:  Diagnosis Date  . Coronary artery disease    had diffuse three-vessel coronary disease without focal stenosis at that time -- Last cath was in 2007showed anterior wall hypokinesis with well-preserved apical and inferior wall motion with and EF of approximately 45% -- last cardiolite study 12/09 this showed an EF of 47% with anterior akinesis, anterior scar , & no ischemia general  . Fatigue   . History of acute anterior wall myocardial infarction 04/25/1999   with a stent placement in her first diagonal vessel, and subsequent cutting balloon to the diagonal in 2001  . History of shingles   . Hypercholesterolemia   . Hypertension   . Weakness     Past Surgical History:  Procedure Laterality Date  . ABDOMINAL HYSTERECTOMY    . BREAST BIOPSY     Bilateral  . BREAST EXCISIONAL BIOPSY Bilateral   . CHOLECYSTECTOMY    .  LEFT HEART CATHETERIZATION WITH CORONARY ANGIOGRAM N/A 12/18/2012   Procedure: LEFT HEART CATHETERIZATION WITH CORONARY ANGIOGRAM;  Surgeon: Burnell Blanks, MD;  Location: [facility] CATH LAB;  Service: Cardiovascular;  Laterality: N/A;    Current Outpatient Prescriptions  Medication Sig Dispense Refill  . aspirin 81 MG tablet Take 81 mg by mouth daily. As needed for pain    . calcium carbonate (OS-CAL) 600 MG TABS Take 600 mg by mouth daily.      Marland Kitchen estrogens, conjugated, (PREMARIN) 0.3 MG tablet Take 0.3 mg by mouth as directed. EVERY three (3)  DAYS    . isosorbide mononitrate (IMDUR) 30 MG 24 hr tablet TAKE 1/2 TABLET BY MOUTH DAILY. 15 tablet 11  . KLOR-CON M20 20 MEQ tablet TAKE 1 TABLET BY MOUTH EVERY DAY 30 tablet 1  . losartan-hydrochlorothiazide (HYZAAR) 100-12.5 MG tablet TAKE 1 TABLET BY MOUTH DAILY 30 tablet 11  . metoprolol succinate (TOPROL-XL) 50 MG 24 hr tablet TAKE 1 TABLET (50 MG TOTAL) BY MOUTH DAILY. 30 tablet 11  . NITROSTAT 0.4 MG SL tablet PLACE 1 TABLET (0.4 MG TOTAL) UNDER THE TONGUE EVERY 5 (FIVE) MINUTES AS NEEDED FOR CHEST PAIN. 25 tablet 5  . simvastatin (ZOCOR) 40 MG tablet TAKE 1/2 TABLET BY MOUTH AT BEDTIME 30 tablet 2   No current facility-administered medications for this visit.     Allergies  Allergen Reactions  . Actonel [Risedronate Sodium] Other (See Comments)    CHEST PAIN/COUGH  . Altace [Ramipril] Other (See  Comments)    CHEST PAIN/COUGH    Social History   Social History  . Marital status: Married    Spouse name: N/A  . Number of children: 3  . Years of education: N/A   Occupational History  . Retired-[employer] Retired   Social History Main Topics  . Smoking status: Never Smoker  . Smokeless tobacco: Never Used  . Alcohol use No  . Drug use: No  . Sexual activity: Not on file   Other Topics Concern  . Not on file   Social History Narrative  . No narrative on file    Family History  Problem Relation Age of Onset  .  Liver disease Mother   . Heart disease Father   . Dementia Sister   . Heart disease Brother   . Healthy Sister     Review of Systems:  As stated in the HPI and otherwise negative.   BP 110/78   Pulse (!) 57   Ht _0  (1.6 m)   Wt 118 lb 12.8 oz (53.9 kg)   SpO2 94%   BMI 21.04 kg/m   Physical Examination: General: Well developed, well nourished, NAD  HEENT: OP clear, mucus membranes moist  SKIN: warm, dry. No rashes. Neuro: No focal deficits  Musculoskeletal: Muscle strength 5/5 all ext  Psychiatric: Mood and affect normal  Neck: No JVD, no carotid bruits, no thyromegaly, no lymphadenopathy.  Lungs:Clear bilaterally, no wheezes, rhonci, crackles Cardiovascular: Regular rate and rhythm. No murmurs, gallops or rubs. Abdomen:Soft. Bowel sounds present. Non-tender.  Extremities: No lower extremity edema. Pulses are 2 + in the bilateral DP/PT.   Cardiac cath 12/18/12: Left main: No obstructive disease.  Left Anterior Descending Artery: Moderate caliber vessel that courses to the apex. The vessel becomes very small in caliber at the apex. There are no obstructive lesions in the LAD. The diagonal branch is moderate in caliber with a patent stent. There is diffuse 40% restenosis within the stent. This does not appear to be flow limiting. Just beyond the stent there is a 40% stenosis.  Circumflex Artery: Large caliber vessel that continues as a large obtuse marginal branch. The proximal Circumflex has a focal 40% stenosis. The ostium of the first OM branch has 30% stenosis. The continuation of the AV groove Circumflex beyond the takeoff of the OM branch is small in caliber.  Right Coronary Artery: Large dominant vessel with diffuse 40% mid stenosis. There is mild disease in the posterolateral branch and PDA.  Left Ventricular Angiogram: LVEF=40%, hypokinesis of the anterior wall, apex.  Impression:  1. Moderate non-obstructive CAD  2. Moderate segmental LV systolic dysfunction    Recommendations: Continue medical management of CAD.   Echo 04/19/15: Left ventricle: The cavity size was normal. There was mild focal basal hypertrophy of the septum. Systolic function was normal. The estimated ejection fraction was in the range of 50% to 55%. Doppler parameters are consistent with abnormal left ventricular relaxation (grade 1 diastolic dysfunction). The E/e&' ratio is between 8-15, suggesting indeterminate LV filling pressure. - Mitral valve: Mildly thickened leaflets . There was trivial regurgitation. - Tricuspid valve: There was mild regurgitation. - Pulmonary arteries: PA peak pressure: 32 mm Hg (S) + RAP. - Systemic veins: IVC not visualized. Impressions:  - Technically difficult study. LVEF 50-55%, no clear regional wall motion abnormality, mild proximal septal thickening, diastolic dysfunction with indeterminate LV filling pressure, normal atrial size, mild TR, RVSP 32 mmHg + RAP  EKG:  EKG is ordered  today. The EKG demonstrates Sinus bradycardia, rate 57 bpm. Poor R wave progression.   Recent Labs: No results found for requested labs within last 8760 hours.   Lipid Panel    Component Value Date/Time   CHOL 153 12/11/2012 0804   TRIG 133.0 12/11/2012 0804   HDL 49.10 12/11/2012 0804   CHOLHDL 3 12/11/2012 0804   VLDL 26.6 12/11/2012 0804   LDLCALC 77 12/11/2012 0804     Wt Readings from Last 3 Encounters:  06/30/16 118 lb 12.8 oz (53.9 kg)  04/30/15 122 lb 6.4 oz (55.5 kg)  04/19/15 122 lb (55.3 kg)     Other studies Reviewed: Additional studies/ records that were reviewed today include: . Review of the above records demonstrates:    Assessment and Plan:   1. CAD with stable angina: She is having no chest pain suggestive of angina. She is known to have mild to moderate CAD. Last cath 12/18/12 with stable mild to moderate CAD. Nuclear stress test 04/19/15 with no ischemia. LV function is normal by echo. Will continue medical  therapy with ASA, statin, beta blocker, Imdur and ARB.    2. HTN: Her BP is well controlled. No changes today.  3. Hyperlipidemia: Lipids followed in primary care. Continue statin.   4. Premature ventricular contractions: No recent palpitations. Will continue beta blocker.   Current medicines are reviewed at length with the patient today.  The patient does not have concerns regarding medicines.  The following changes have been made:  no change  Labs/ tests ordered today include:   No orders of the defined types were placed in this encounter.   Disposition:   FU with me in 12  months  Signed, Lauree Chandler, MD 06/30/2016 11:19 AM    [facility] [address], [place], [place]  [address] Phone: [phone]; Fax: [phone]

## 2016-07-08 ENCOUNTER — Other Ambulatory Visit: Payer: Self-pay | Admitting: Cardiovascular Disease

## 2016-07-19 ENCOUNTER — Other Ambulatory Visit: Payer: Self-pay | Admitting: Cardiovascular Disease

## 2016-07-19 NOTE — Telephone Encounter (Signed)
OK to refill.  I spoke with pt and she will have primary care (Dr. Deforest Hoyles at [facility]) send [place] most recent lab work.

## 2016-07-19 NOTE — Telephone Encounter (Signed)
Okay to refill or should this be deferred to patients pcp? Please advise. Thanks, MI 

## 2016-07-25 ENCOUNTER — Other Ambulatory Visit: Payer: Self-pay | Admitting: Cardiovascular Disease

## 2016-08-18 ENCOUNTER — Other Ambulatory Visit: Payer: Self-pay | Admitting: Cardiovascular Disease

## 2016-09-06 DIAGNOSIS — E7800 Pure hypercholesterolemia, unspecified: Secondary | ICD-10-CM | POA: Diagnosis not present

## 2016-09-06 DIAGNOSIS — Z0000 Encounter for general adult medical examination without abnormal findings: Secondary | ICD-10-CM | POA: Diagnosis not present

## 2016-09-06 DIAGNOSIS — I10 Essential (primary) hypertension: Secondary | ICD-10-CM | POA: Diagnosis not present

## 2016-09-06 DIAGNOSIS — I2510 Atherosclerotic heart disease of native coronary artery without angina pectoris: Secondary | ICD-10-CM | POA: Diagnosis not present

## 2016-09-06 DIAGNOSIS — I252 Old myocardial infarction: Secondary | ICD-10-CM | POA: Diagnosis not present

## 2016-09-06 DIAGNOSIS — E039 Hypothyroidism, unspecified: Secondary | ICD-10-CM | POA: Diagnosis not present

## 2016-09-06 DIAGNOSIS — M810 Age-related osteoporosis without current pathological fracture: Secondary | ICD-10-CM | POA: Diagnosis not present

## 2016-09-06 DIAGNOSIS — Z1389 Encounter for screening for other disorder: Secondary | ICD-10-CM | POA: Diagnosis not present

## 2016-09-06 DIAGNOSIS — N183 Chronic kidney disease, stage 3 (moderate): Secondary | ICD-10-CM | POA: Diagnosis not present

## 2016-10-23 DIAGNOSIS — E038 Other specified hypothyroidism: Secondary | ICD-10-CM | POA: Diagnosis not present

## 2016-12-15 DIAGNOSIS — Z23 Encounter for immunization: Secondary | ICD-10-CM | POA: Diagnosis not present

## 2017-01-31 ENCOUNTER — Telehealth: Payer: Self-pay | Admitting: Cardiovascular Disease

## 2017-01-31 NOTE — Telephone Encounter (Signed)
I spoke with pt. She is concerned about thyroid levels and medication for this. She is asking if this is something Dr. Angelena Form could check.  She is currently seeing primary care for this.  I told her we did not follow thyroid issues in our office and asked her to continue to follow with primary care for this.

## 2017-01-31 NOTE — Telephone Encounter (Signed)
[name] is calling to see if she could have her lab work checked by Dr. Angelena Form . Please call

## 2017-02-09 DIAGNOSIS — R300 Dysuria: Secondary | ICD-10-CM | POA: Diagnosis not present

## 2017-02-09 DIAGNOSIS — N3000 Acute cystitis without hematuria: Secondary | ICD-10-CM | POA: Diagnosis not present

## 2017-05-21 ENCOUNTER — Telehealth: Payer: Self-pay | Admitting: Cardiovascular Disease

## 2017-05-21 NOTE — Telephone Encounter (Signed)
Would recommend switching from losartan 100mg  to olmesartan 40mg  daily. Olmesartan does not come in combination like the losartan-HCTZ does, however olmesartan is slightly more effective than losartan at lowering BP. Could start with just olmesartan 40mg  daily and advise pt to monitor BP. If needed, could add back HCTZ 12.5mg  daily component as a separate tablet.

## 2017-05-21 NOTE — Telephone Encounter (Signed)
I spoke with pt. She reports she saw on TV losartan had been recalled. She has not received notification of recall from her pharmacy or insurance company.  She will check with her pharmacy and see if her lot of medication is involved in recall and if pharmacy will be able to continue to supply this medication.  I told her to call us back if her medication is recalled or pharmacy unable to supply it and we can change to Olmesartan.

## 2017-05-21 NOTE — Telephone Encounter (Signed)
Pt calling stating that it is a recall on her medication of losartan-hydrochlorothiazide and would Dr. Camillia Herter nurse to give her a call back concerning this matter. Please address

## 2017-06-06 DIAGNOSIS — M25521 Pain in right elbow: Secondary | ICD-10-CM | POA: Diagnosis not present

## 2017-06-06 DIAGNOSIS — M7541 Impingement syndrome of right shoulder: Secondary | ICD-10-CM | POA: Diagnosis not present

## 2017-06-26 ENCOUNTER — Other Ambulatory Visit: Payer: Self-pay | Admitting: Cardiovascular Disease

## 2017-06-26 DIAGNOSIS — H43813 Vitreous degeneration, bilateral: Secondary | ICD-10-CM | POA: Diagnosis not present

## 2017-06-26 DIAGNOSIS — H04123 Dry eye syndrome of bilateral lacrimal glands: Secondary | ICD-10-CM | POA: Diagnosis not present

## 2017-06-26 DIAGNOSIS — H26493 Other secondary cataract, bilateral: Secondary | ICD-10-CM | POA: Diagnosis not present

## 2017-06-26 DIAGNOSIS — H52203 Unspecified astigmatism, bilateral: Secondary | ICD-10-CM | POA: Diagnosis not present

## 2017-06-27 ENCOUNTER — Ambulatory Visit: Payer: Medicare Other | Admitting: Cardiovascular Disease

## 2017-07-08 NOTE — Progress Notes (Signed)
Chief Complaint  Patient presents with  . Follow-up    CAD    History of Present Illness: 84 yo female with history of CAD, HTN, and HLD here today for cardiac follow up. She had an anterolateral MI in 2001 with stent placement in the Diagonal branch of the LAD. I met her in 2014. Stress myoview 12/11/12 with large scar in the anterior wall, LVEF not calculated. Cardiac cath 12/18/12 with mild to moderate disease in the diagonal, Circumflex and RCA. She was seen in the ED January 2017 with dyspnea and palpitations. Troponin negative and EKG without ischemic changes. Follow up here in our office with Estella Husk, PA-C 04/06/15 and echo, stress test and cardiac monitor arranged. Echo 04/19/15 with normal LV systolic function, mild diastolic dysfunction. Nuclear stress test 04/19/15 with scar but no ischemia. Cardiac monitor 2017 with sinus, PACs, PVCs.   She is here today for follow up. The patient denies any chest pain, dyspnea, palpitations, lower extremity edema, orthopnea, PND, dizziness, near syncope or syncope.    Primary Care Physician: Marzetta Board, MD  Past Medical History:  Diagnosis Date  . Coronary artery disease    had diffuse three-vessel coronary disease without focal stenosis at that time -- Last cath was in 2007showed anterior wall hypokinesis with well-preserved apical and inferior wall motion with and EF of approximately 45% -- last cardiolite study 12/09 this showed an EF of 47% with anterior akinesis, anterior scar , & no ischemia general  . Fatigue   . History of acute anterior wall myocardial infarction 04/25/1999   with a stent placement in her first diagonal vessel, and subsequent cutting balloon to the diagonal in 2001  . History of shingles   . Hypercholesterolemia   . Hypertension   . Weakness     Past Surgical History:  Procedure Laterality Date  . ABDOMINAL HYSTERECTOMY    . BREAST BIOPSY     Bilateral  . BREAST EXCISIONAL BIOPSY Bilateral   .  CHOLECYSTECTOMY    . LEFT HEART CATHETERIZATION WITH CORONARY ANGIOGRAM N/A 12/18/2012   Procedure: LEFT HEART CATHETERIZATION WITH CORONARY ANGIOGRAM;  Surgeon: Burnell Blanks, MD;  Location: [facility] CATH LAB;  Service: Cardiovascular;  Laterality: N/A;    Current Outpatient Medications  Medication Sig Dispense Refill  . aspirin 81 MG tablet Take 81 mg by mouth daily. As needed for pain    . calcium carbonate (OS-CAL) 600 MG TABS Take 600 mg by mouth daily.      . isosorbide mononitrate (IMDUR) 30 MG 24 hr tablet Take 0.5 tablets (15 mg total) by mouth daily. Please schedule appt for more refills, thanks! 1st ATTMPT [phone] 15 tablet 0  . KLOR-CON M20 20 MEQ tablet TAKE 1 TABLET BY MOUTH EVERY DAY 30 tablet 11  . losartan-hydrochlorothiazide (HYZAAR) 100-12.5 MG tablet Take 1 tablet by mouth daily. Please schedule appt for more refills, thanks! 1stATTMPT [phone] 30 tablet 0  . metoprolol succinate (TOPROL-XL) 50 MG 24 hr tablet Take 1 tablet (50 mg total) by mouth daily. Please schedule appt for more refills, thanks! 1stATTMPT [phone] 30 tablet 0  . nitroGLYCERIN (NITROSTAT) 0.4 MG SL tablet PLACE 1 TABLET (0.4 MG TOTAL) UNDER THE TONGUE EVERY 5 (FIVE) MINUTES AS NEEDED FOR CHEST PAIN. 25 tablet 5  . simvastatin (ZOCOR) 40 MG tablet TAKE 1/2 TABLET BY MOUTH AT BEDTIME 30 tablet 6   No current facility-administered medications for this visit.     Allergies  Allergen Reactions  . Actonel [employer]  Sodium] Other (See Comments)    CHEST PAIN/COUGH  . Altace [Ramipril] Other (See Comments)    CHEST PAIN/COUGH    Social History   Socioeconomic History  . Marital status: Married    Spouse name: Not on file  . Number of children: 3  . Years of education: Not on file  . Highest education level: Not on file  Occupational History  . Occupation: Geographical information systems officer: RETIRED  Social Needs  . Financial resource strain: Not on file  . Food insecurity:      Worry: Not on file    Inability: Not on file  . Transportation needs:    Medical: Not on file    Non-medical: Not on file  Tobacco Use  . Smoking status: Never Smoker  . Smokeless tobacco: Never Used  Substance and Sexual Activity  . Alcohol use: No    Alcohol/week: 0.0 oz  . Drug use: No  . Sexual activity: Not on file  Lifestyle  . Physical activity:    Days per week: Not on file    Minutes per session: Not on file  . Stress: Not on file  Relationships  . Social connections:    Talks on phone: Not on file    Gets together: Not on file    Attends religious service: Not on file    Active member of club or organization: Not on file    Attends meetings of clubs or organizations: Not on file    Relationship status: Not on file  . Intimate partner violence:    Fear of current or ex partner: Not on file    Emotionally abused: Not on file    Physically abused: Not on file    Forced sexual activity: Not on file  Other Topics Concern  . Not on file  Social History Narrative  . Not on file    Family History  Problem Relation Age of Onset  . Liver disease Mother   . Heart disease Father   . Dementia Sister   . Heart disease Brother   . Healthy Sister     Review of Systems:  As stated in the HPI and otherwise negative.   BP (!) 148/70   Pulse (!) 56   Ht '5\' 3"'$  (1.6 m)   Wt 115 lb 6.4 oz (52.3 kg)   SpO2 98%   BMI 20.44 kg/m   Physical Examination:  General: Well developed, well nourished, NAD  HEENT: OP clear, mucus membranes moist  SKIN: warm, dry. No rashes. Neuro: No focal deficits  Musculoskeletal: Muscle strength 5/5 all ext  Psychiatric: Mood and affect normal  Neck: No JVD, no carotid bruits, no thyromegaly, no lymphadenopathy.  Lungs:Clear bilaterally, no wheezes, rhonci, crackles Cardiovascular: Regular rate and rhythm. No murmurs, gallops or rubs. Abdomen:Soft. Bowel sounds present. Non-tender.  Extremities: No lower extremity edema. Pulses are 2  + in the bilateral DP/PT.  Cardiac cath 12/18/12: Left main: No obstructive disease.  Left Anterior Descending Artery: Moderate caliber vessel that courses to the apex. The vessel becomes very small in caliber at the apex. There are no obstructive lesions in the LAD. The diagonal branch is moderate in caliber with a patent stent. There is diffuse 40% restenosis within the stent. This does not appear to be flow limiting. Just beyond the stent there is a 40% stenosis.  Circumflex Artery: Large caliber vessel that continues as a large obtuse marginal branch. The proximal Circumflex has a focal 40%  stenosis. The ostium of the first OM branch has 30% stenosis. The continuation of the AV groove Circumflex beyond the takeoff of the OM branch is small in caliber.  Right Coronary Artery: Large dominant vessel with diffuse 40% mid stenosis. There is mild disease in the posterolateral branch and PDA.  Left Ventricular Angiogram: LVEF=40%, hypokinesis of the anterior wall, apex.  Impression:  1. Moderate non-obstructive CAD  2. Moderate segmental LV systolic dysfunction  Recommendations: Continue medical management of CAD.   Echo 04/19/15: Left ventricle: The cavity size was normal. There was mild focal basal hypertrophy of the septum. Systolic function was normal. The estimated ejection fraction was in the range of 50% to 55%. Doppler parameters are consistent with abnormal left ventricular relaxation (grade 1 diastolic dysfunction). The E/e&' ratio is between 8-15, suggesting indeterminate LV filling pressure. - Mitral valve: Mildly thickened leaflets . There was trivial regurgitation. - Tricuspid valve: There was mild regurgitation. - Pulmonary arteries: PA peak pressure: 32 mm Hg (S) + RAP. - Systemic veins: IVC not visualized. Impressions:  - Technically difficult study. LVEF 50-55%, no clear regional wall motion abnormality, mild proximal septal thickening, diastolic dysfunction  with indeterminate LV filling pressure, normal atrial size, mild TR, RVSP 32 mmHg + RAP  EKG:  EKG is ordered today. The EKG demonstrates sinus brady, rate 56 bpm. Old septal infarct.   Recent Labs: No results found for requested labs within last 8760 hours.   Lipid Panel Followed in primary care   Wt Readings from Last 3 Encounters:  07/09/17 115 lb 6.4 oz (52.3 kg)  06/30/16 118 lb 12.8 oz (53.9 kg)  04/30/15 122 lb 6.4 oz (55.5 kg)     Other studies Reviewed: Additional studies/ records that were reviewed today include: . Review of the above records demonstrates:    Assessment and Plan:   1. CAD with stable angina: No change in chest pain. She is known to have mild to moderate CAD. Last cath 12/18/12 with stable mild to moderate CAD. Nuclear stress test 04/19/15 with no ischemia. LV function is normal by echo in 2017. Continue ASA, statin, Imdur and beta blocker.     2. HTN: BP is controlled at home. No changes.   3. Hyperlipidemia: Lipids followed in primary care and controlled per pt. Continue statin.    4. Premature ventricular contractions: No palpitations. Continue beta blocker.    Current medicines are reviewed at length with the patient today.  The patient does not have concerns regarding medicines.  The following changes have been made:  no change  Labs/ tests ordered today include:   Orders Placed This Encounter  Procedures  . EKG 12-Lead    Disposition:   FU with me in 6  months  Signed, Lauree Chandler, MD 07/09/2017 9:44 AM    [facility] [address], [place], [place]  [address] Phone: [phone]; Fax: [phone]

## 2017-07-09 ENCOUNTER — Telehealth: Payer: Self-pay | Admitting: Cardiovascular Disease

## 2017-07-09 ENCOUNTER — Ambulatory Visit (INDEPENDENT_AMBULATORY_CARE_PROVIDER_SITE_OTHER): Payer: Medicare Other | Admitting: Cardiovascular Disease

## 2017-07-09 ENCOUNTER — Encounter: Payer: Self-pay | Admitting: Cardiovascular Disease

## 2017-07-09 VITALS — BP 148/70 | HR 56 | Ht 63.0 in | Wt 115.4 lb

## 2017-07-09 DIAGNOSIS — I25119 Atherosclerotic heart disease of native coronary artery with unspecified angina pectoris: Secondary | ICD-10-CM

## 2017-07-09 DIAGNOSIS — E7800 Pure hypercholesterolemia, unspecified: Secondary | ICD-10-CM | POA: Diagnosis not present

## 2017-07-09 DIAGNOSIS — I10 Essential (primary) hypertension: Secondary | ICD-10-CM

## 2017-07-09 DIAGNOSIS — I493 Ventricular premature depolarization: Secondary | ICD-10-CM

## 2017-07-09 NOTE — Telephone Encounter (Signed)
Records received from [place] office. Gave records to [facility].

## 2017-07-09 NOTE — Patient Instructions (Signed)
Medication Instructions:  Your physician recommends that you continue on your current medications as directed. Please refer to the Current Medication list given to you today.   Labwork: none  Testing/Procedures: none  Follow-Up: Your physician recommends that you schedule a follow-up appointment in: October.  Please call our office in early June to schedule this appointment    Any Other Special Instructions Will Be Listed Below (If Applicable).     If you need a refill on your cardiac medications before your next appointment, please call your pharmacy.

## 2017-07-11 ENCOUNTER — Telehealth: Payer: Self-pay | Admitting: *Deleted

## 2017-07-11 NOTE — Telephone Encounter (Signed)
I spoke with pt who reports lipid profile was checked on 09/06/16.  I told her I would contact [facility] to request these labs. I spoke with medical records at [facility] and they will fax lab work to our office.

## 2017-07-11 NOTE — Telephone Encounter (Signed)
Received lab results from Dr. Glenna Durand office (urine, TSH, Free T4).  No lipid and liver profiles.  I spoke with pt who reports she has lab results at home.  She will locate these and call us back to let us know if lipid and liver profiles were checked.

## 2017-07-15 ENCOUNTER — Telehealth: Payer: Self-pay | Admitting: Cardiovascular Disease

## 2017-07-17 NOTE — Telephone Encounter (Signed)
Per epic, patient has not had her potassium level checked recently. Okay to refill? Please advise. Thanks, MI

## 2017-07-18 MED ORDER — POTASSIUM CHLORIDE CRYS ER 20 MEQ PO TBCR: 20.0000 meq | EXTENDED_RELEASE_TABLET | Freq: Every day | ORAL | 3 refills | Status: DC

## 2017-07-18 NOTE — Telephone Encounter (Signed)
OK to refill . We are waiting for labs from primary care.

## 2017-07-18 NOTE — Telephone Encounter (Signed)
New message     *STAT* If patient is at the pharmacy, call can be transferred to refill team.   1. Which medications need to be refilled? (please list name of each medication and dose if known) KLOR-CON M20 20 MEQ tablet  2. Which pharmacy/location (including street and city if local pharmacy) is medication to be sent to? [facility] [place]  3. Do they need a 30 day or 90 day supply? [place]

## 2017-07-18 NOTE — Telephone Encounter (Signed)
Labs have been scanned in

## 2017-07-18 NOTE — Addendum Note (Signed)
Addended by: Juventino Slovak on: 07/18/2017 09:03 AM   Modules accepted: Orders

## 2017-07-28 ENCOUNTER — Other Ambulatory Visit: Payer: Self-pay | Admitting: Cardiovascular Disease

## 2017-07-30 ENCOUNTER — Other Ambulatory Visit: Payer: Self-pay | Admitting: Cardiovascular Disease

## 2017-08-01 ENCOUNTER — Other Ambulatory Visit: Payer: Self-pay | Admitting: Cardiovascular Disease

## 2017-09-07 ENCOUNTER — Other Ambulatory Visit: Payer: Self-pay | Admitting: Internal Medicine

## 2017-09-07 DIAGNOSIS — E039 Hypothyroidism, unspecified: Secondary | ICD-10-CM | POA: Diagnosis not present

## 2017-09-07 DIAGNOSIS — N183 Chronic kidney disease, stage 3 (moderate): Secondary | ICD-10-CM | POA: Diagnosis not present

## 2017-09-07 DIAGNOSIS — Z1239 Encounter for other screening for malignant neoplasm of breast: Secondary | ICD-10-CM | POA: Diagnosis not present

## 2017-09-07 DIAGNOSIS — Z1211 Encounter for screening for malignant neoplasm of colon: Secondary | ICD-10-CM | POA: Diagnosis not present

## 2017-09-07 DIAGNOSIS — Z1389 Encounter for screening for other disorder: Secondary | ICD-10-CM | POA: Diagnosis not present

## 2017-09-07 DIAGNOSIS — I252 Old myocardial infarction: Secondary | ICD-10-CM | POA: Diagnosis not present

## 2017-09-07 DIAGNOSIS — I10 Essential (primary) hypertension: Secondary | ICD-10-CM | POA: Diagnosis not present

## 2017-09-07 DIAGNOSIS — Z1231 Encounter for screening mammogram for malignant neoplasm of breast: Secondary | ICD-10-CM

## 2017-09-07 DIAGNOSIS — I2510 Atherosclerotic heart disease of native coronary artery without angina pectoris: Secondary | ICD-10-CM | POA: Diagnosis not present

## 2017-09-07 DIAGNOSIS — Z0000 Encounter for general adult medical examination without abnormal findings: Secondary | ICD-10-CM | POA: Diagnosis not present

## 2017-09-07 DIAGNOSIS — M8588 Other specified disorders of bone density and structure, other site: Secondary | ICD-10-CM | POA: Diagnosis not present

## 2017-09-07 DIAGNOSIS — E7800 Pure hypercholesterolemia, unspecified: Secondary | ICD-10-CM | POA: Diagnosis not present

## 2017-09-10 DIAGNOSIS — Z1211 Encounter for screening for malignant neoplasm of colon: Secondary | ICD-10-CM | POA: Diagnosis not present

## 2017-09-28 ENCOUNTER — Ambulatory Visit: Payer: Medicare Other

## 2017-09-28 ENCOUNTER — Ambulatory Visit
Admission: RE | Admit: 2017-09-28 | Discharge: 2017-09-28 | Disposition: A | Discharge status: Home / Self Care | Payer: Medicare Other | Source: Ambulatory Visit | Attending: Internal Medicine | Admitting: Internal Medicine

## 2017-09-28 DIAGNOSIS — Z1231 Encounter for screening mammogram for malignant neoplasm of breast: Secondary | ICD-10-CM

## 2017-12-04 DIAGNOSIS — Z23 Encounter for immunization: Secondary | ICD-10-CM | POA: Diagnosis not present

## 2017-12-11 DIAGNOSIS — Z6820 Body mass index (BMI) 20.0-20.9, adult: Secondary | ICD-10-CM | POA: Diagnosis not present

## 2017-12-11 DIAGNOSIS — Z1389 Encounter for screening for other disorder: Secondary | ICD-10-CM | POA: Diagnosis not present

## 2017-12-11 DIAGNOSIS — N959 Unspecified menopausal and perimenopausal disorder: Secondary | ICD-10-CM | POA: Diagnosis not present

## 2017-12-11 DIAGNOSIS — Z01419 Encounter for gynecological examination (general) (routine) without abnormal findings: Secondary | ICD-10-CM | POA: Diagnosis not present

## 2018-01-09 ENCOUNTER — Ambulatory Visit (INDEPENDENT_AMBULATORY_CARE_PROVIDER_SITE_OTHER): Payer: Medicare Other | Admitting: Cardiovascular Disease

## 2018-01-09 ENCOUNTER — Encounter: Payer: Self-pay | Admitting: Cardiovascular Disease

## 2018-01-09 ENCOUNTER — Encounter (INDEPENDENT_AMBULATORY_CARE_PROVIDER_SITE_OTHER): Payer: Self-pay

## 2018-01-09 VITALS — BP 132/62 | HR 65 | Ht 63.0 in | Wt 116.4 lb

## 2018-01-09 DIAGNOSIS — I493 Ventricular premature depolarization: Secondary | ICD-10-CM | POA: Diagnosis not present

## 2018-01-09 DIAGNOSIS — E7800 Pure hypercholesterolemia, unspecified: Secondary | ICD-10-CM | POA: Diagnosis not present

## 2018-01-09 DIAGNOSIS — I10 Essential (primary) hypertension: Secondary | ICD-10-CM | POA: Diagnosis not present

## 2018-01-09 DIAGNOSIS — I25119 Atherosclerotic heart disease of native coronary artery with unspecified angina pectoris: Secondary | ICD-10-CM | POA: Diagnosis not present

## 2018-01-09 NOTE — Patient Instructions (Signed)

## 2018-01-09 NOTE — Progress Notes (Signed)
Chief Complaint  Patient presents with  . Follow-up    CAD    History of Present Illness: 84 yo female with history of CAD, HTN, and HLD here today for cardiac follow up. She had an anterolateral MI in 2001 with stent placement in the Diagonal branch of the LAD. I met her in 2014. Stress myoview 12/11/12 with large scar in the anterior wall. Cardiac cath 12/18/12 with mild to moderate disease in the diagonal, Circumflex and RCA. She was seen in the ED January 2017 with dyspnea and palpitations. Troponin negative and EKG without ischemic changes. Follow up here in our office with Estella Husk, PA-C 04/06/15 and echo, stress test and cardiac monitor arranged. Echo 04/19/15 with normal LV systolic function, mild diastolic dysfunction. Nuclear stress test 04/19/15 with scar but no ischemia. Cardiac monitor 2017 with sinus, PACs, PVCs.   She is here today for follow up. The patient denies any chest pain, dyspnea, palpitations, lower extremity edema, orthopnea, PND, dizziness, near syncope or syncope. She has right shoulder pain and had a recent injections.    Primary Care Physician: Marzetta Board, MD  Past Medical History:  Diagnosis Date  . Coronary artery disease    had diffuse three-vessel coronary disease without focal stenosis at that time -- Last cath was in 2007showed anterior wall hypokinesis with well-preserved apical and inferior wall motion with and EF of approximately 45% -- last cardiolite study 12/09 this showed an EF of 47% with anterior akinesis, anterior scar , & no ischemia general  . Fatigue   . History of acute anterior wall myocardial infarction 04/25/1999   with a stent placement in her first diagonal vessel, and subsequent cutting balloon to the diagonal in 2001  . History of shingles   . Hypercholesterolemia   . Hypertension   . Weakness     Past Surgical History:  Procedure Laterality Date  . ABDOMINAL HYSTERECTOMY    . BREAST BIOPSY     Bilateral  . BREAST  EXCISIONAL BIOPSY Bilateral   . CHOLECYSTECTOMY    . LEFT HEART CATHETERIZATION WITH CORONARY ANGIOGRAM N/A 12/18/2012   Procedure: LEFT HEART CATHETERIZATION WITH CORONARY ANGIOGRAM;  Surgeon: Burnell Blanks, MD;  Location: [facility] CATH LAB;  Service: Cardiovascular;  Laterality: N/A;    Current Outpatient Medications  Medication Sig Dispense Refill  . aspirin 81 MG tablet Take 81 mg by mouth daily. As needed for pain    . calcium carbonate (OS-CAL) 600 MG TABS Take 600 mg by mouth daily.      . isosorbide mononitrate (IMDUR) 30 MG 24 hr tablet Take 0.5 tablets (15 mg total) by mouth daily. 45 tablet 3  . losartan-hydrochlorothiazide (HYZAAR) 100-12.5 MG tablet Take 1 tablet by mouth daily. 90 tablet 3  . metoprolol succinate (TOPROL-XL) 50 MG 24 hr tablet Take 1 tablet (50 mg total) by mouth daily. 90 tablet 3  . nitroGLYCERIN (NITROSTAT) 0.4 MG SL tablet PLACE 1 TABLET (0.4 MG TOTAL) UNDER THE TONGUE EVERY 5 (FIVE) MINUTES AS NEEDED FOR CHEST PAIN. 25 tablet 5  . potassium chloride SA (KLOR-CON M20) 20 MEQ tablet Take 1 tablet (20 mEq total) by mouth daily. 90 tablet 3  . simvastatin (ZOCOR) 40 MG tablet TAKE 1/2 TABLET BY MOUTH AT BEDTIME 45 tablet 2   No current facility-administered medications for this visit.     Allergies  Allergen Reactions  . Actonel [Risedronate Sodium] Other (See Comments)    CHEST PAIN/COUGH  . Altace [Ramipril] Other (See Comments)  CHEST PAIN/COUGH    Social History   Socioeconomic History  . Marital status: Married    Spouse name: Not on file  . Number of children: 3  . Years of education: Not on file  . Highest education level: Not on file  Occupational History  . Occupation: Geographical information systems officer: RETIRED  Social Needs  . Financial resource strain: Not on file  . Food insecurity:    Worry: Not on file    Inability: Not on file  . Transportation needs:    Medical: Not on file    Non-medical: Not on file  Tobacco Use   . Smoking status: Never Smoker  . Smokeless tobacco: Never Used  Substance and Sexual Activity  . Alcohol use: No    Alcohol/week: 0.0 standard drinks  . Drug use: No  . Sexual activity: Not on file  Lifestyle  . Physical activity:    Days per week: Not on file    Minutes per session: Not on file  . Stress: Not on file  Relationships  . Social connections:    Talks on phone: Not on file    Gets together: Not on file    Attends religious service: Not on file    Active member of club or organization: Not on file    Attends meetings of clubs or organizations: Not on file    Relationship status: Not on file  . Intimate partner violence:    Fear of current or ex partner: Not on file    Emotionally abused: Not on file    Physically abused: Not on file    Forced sexual activity: Not on file  Other Topics Concern  . Not on file  Social History Narrative  . Not on file    Family History  Problem Relation Age of Onset  . Liver disease Mother   . Heart disease Father   . Dementia Sister   . Heart disease Brother   . Healthy Sister     Review of Systems:  As stated in the HPI and otherwise negative.   BP 132/62   Pulse 65   Ht '5\' 3"'$  (1.6 m)   Wt 116 lb 6.4 oz (52.8 kg)   SpO2 96%   BMI 20.62 kg/m   Physical Examination:  General: Well developed, well nourished, NAD  HEENT: OP clear, mucus membranes moist  SKIN: warm, dry. No rashes. Neuro: No focal deficits  Musculoskeletal: Muscle strength 5/5 all ext  Psychiatric: Mood and affect normal  Neck: No JVD, no carotid bruits, no thyromegaly, no lymphadenopathy.  Lungs:Clear bilaterally, no wheezes, rhonci, crackles Cardiovascular: Regular rate and rhythm. No murmurs, gallops or rubs. Abdomen:Soft. Bowel sounds present. Non-tender.  Extremities: No lower extremity edema. Pulses are 2 + in the bilateral DP/PT.  Cardiac cath 12/18/12: Left main: No obstructive disease.  Left Anterior Descending Artery: Moderate  caliber vessel that courses to the apex. The vessel becomes very small in caliber at the apex. There are no obstructive lesions in the LAD. The diagonal branch is moderate in caliber with a patent stent. There is diffuse 40% restenosis within the stent. This does not appear to be flow limiting. Just beyond the stent there is a 40% stenosis.  Circumflex Artery: Large caliber vessel that continues as a large obtuse marginal branch. The proximal Circumflex has a focal 40% stenosis. The ostium of the first OM branch has 30% stenosis. The continuation of the AV groove Circumflex beyond the takeoff  of the OM branch is small in caliber.  Right Coronary Artery: Large dominant vessel with diffuse 40% mid stenosis. There is mild disease in the posterolateral branch and PDA.  Left Ventricular Angiogram: LVEF=40%, hypokinesis of the anterior wall, apex.  Impression:  1. Moderate non-obstructive CAD  2. Moderate segmental LV systolic dysfunction  Recommendations: Continue medical management of CAD.   Echo 04/19/15: Left ventricle: The cavity size was normal. There was mild focal basal hypertrophy of the septum. Systolic function was normal. The estimated ejection fraction was in the range of 50% to 55%. Doppler parameters are consistent with abnormal left ventricular relaxation (grade 1 diastolic dysfunction). The E/e&' ratio is between 8-15, suggesting indeterminate LV filling pressure. - Mitral valve: Mildly thickened leaflets . There was trivial regurgitation. - Tricuspid valve: There was mild regurgitation. - Pulmonary arteries: PA peak pressure: 32 mm Hg (S) + RAP. - Systemic veins: IVC not visualized. Impressions:  - Technically difficult study. LVEF 50-55%, no clear regional wall motion abnormality, mild proximal septal thickening, diastolic dysfunction with indeterminate LV filling pressure, normal atrial size, mild TR, RVSP 32 mmHg + RAP  EKG:  EKG is not  ordered today. The  EKG demonstrates    Recent Labs: No results found for requested labs within last 8760 hours.   Lipid Panel Followed in primary care   Wt Readings from Last 3 Encounters:  01/09/18 116 lb 6.4 oz (52.8 kg)  07/09/17 115 lb 6.4 oz (52.3 kg)  06/30/16 118 lb 12.8 oz (53.9 kg)     Other studies Reviewed: Additional studies/ records that were reviewed today include: . Review of the above records demonstrates:    Assessment and Plan:   1. CAD with stable angina: Rare chest pains. Last cath 12/18/12 with stable mild to moderate CAD. Nuclear stress test 04/19/15 with no ischemia. LV function is normal by echo in 2017. Will continue ASA, statin, Imdur and beta blocker.    2. HTN: BP is controlled. No changes.   3. Hyperlipidemia: LDL 49 in June 2019. This is followed in primary care. Continue statin.   4. Premature ventricular contractions: No recent awareness of palpitations. Continue beta blocker.   Current medicines are reviewed at length with the patient today.  The patient does not have concerns regarding medicines.  The following changes have been made:  no change  Labs/ tests ordered today include:   No orders of the defined types were placed in this encounter.   Disposition:   FU with me in 12 months  Signed, Lauree Chandler, MD 01/09/2018 9:15 AM    [address], [place],   [address] Phone: [phone]; Fax: [phone]

## 2018-01-14 ENCOUNTER — Other Ambulatory Visit: Payer: Self-pay | Admitting: Cardiovascular Disease

## 2018-03-25 ENCOUNTER — Other Ambulatory Visit: Payer: Self-pay | Admitting: Cardiovascular Disease

## 2018-05-06 ENCOUNTER — Other Ambulatory Visit: Payer: Self-pay | Admitting: Cardiovascular Disease

## 2018-07-08 ENCOUNTER — Other Ambulatory Visit: Payer: Self-pay | Admitting: Cardiovascular Disease

## 2018-07-27 ENCOUNTER — Other Ambulatory Visit: Payer: Self-pay | Admitting: Cardiovascular Disease

## 2018-08-02 DIAGNOSIS — H0100A Unspecified blepharitis right eye, upper and lower eyelids: Secondary | ICD-10-CM | POA: Diagnosis not present

## 2018-08-02 DIAGNOSIS — H524 Presbyopia: Secondary | ICD-10-CM | POA: Diagnosis not present

## 2018-08-02 DIAGNOSIS — H43813 Vitreous degeneration, bilateral: Secondary | ICD-10-CM | POA: Diagnosis not present

## 2018-08-02 DIAGNOSIS — H26493 Other secondary cataract, bilateral: Secondary | ICD-10-CM | POA: Diagnosis not present

## 2018-09-25 ENCOUNTER — Other Ambulatory Visit: Payer: Self-pay | Admitting: Cardiovascular Disease

## 2018-10-08 ENCOUNTER — Other Ambulatory Visit: Payer: Self-pay | Admitting: Cardiovascular Disease

## 2018-10-09 NOTE — Telephone Encounter (Signed)
Outpatient Medication Detail   Disp Refills Start End   losartan-hydrochlorothiazide (HYZAAR) 100-12.5 MG tablet 90 tablet 0 09/25/2018    Sig: TAKE 1 TABLET BY MOUTH EVERY DAY   Sent to pharmacy as: losartan-hydrochlorothiazide (HYZAAR) 100-12.5 MG tablet   E-Prescribing Status: Receipt confirmed by pharmacy (09/25/2018 11:38 AM EDT)   Pharmacy  [id number] - [place], [place] - [address]

## 2018-10-16 ENCOUNTER — Other Ambulatory Visit: Payer: Self-pay | Admitting: Cardiovascular Disease

## 2018-10-16 DIAGNOSIS — M25562 Pain in left knee: Secondary | ICD-10-CM | POA: Diagnosis not present

## 2018-10-16 MED ORDER — LOSARTAN POTASSIUM-HCTZ 100-12.5 MG PO TABS: 1.0000 | ORAL_TABLET | Freq: Every day | ORAL | 0 refills | Status: DC

## 2018-10-23 ENCOUNTER — Other Ambulatory Visit: Payer: Self-pay | Admitting: Internal Medicine

## 2018-10-23 DIAGNOSIS — Z1231 Encounter for screening mammogram for malignant neoplasm of breast: Secondary | ICD-10-CM

## 2018-11-21 ENCOUNTER — Other Ambulatory Visit: Payer: Self-pay | Admitting: Internal Medicine

## 2018-11-21 DIAGNOSIS — I252 Old myocardial infarction: Secondary | ICD-10-CM | POA: Diagnosis not present

## 2018-11-21 DIAGNOSIS — I10 Essential (primary) hypertension: Secondary | ICD-10-CM | POA: Diagnosis not present

## 2018-11-21 DIAGNOSIS — N183 Chronic kidney disease, stage 3 (moderate): Secondary | ICD-10-CM | POA: Diagnosis not present

## 2018-11-21 DIAGNOSIS — M8580 Other specified disorders of bone density and structure, unspecified site: Secondary | ICD-10-CM

## 2018-11-21 DIAGNOSIS — Z1389 Encounter for screening for other disorder: Secondary | ICD-10-CM | POA: Diagnosis not present

## 2018-11-21 DIAGNOSIS — E7800 Pure hypercholesterolemia, unspecified: Secondary | ICD-10-CM | POA: Diagnosis not present

## 2018-11-21 DIAGNOSIS — I2510 Atherosclerotic heart disease of native coronary artery without angina pectoris: Secondary | ICD-10-CM | POA: Diagnosis not present

## 2018-11-21 DIAGNOSIS — Z0000 Encounter for general adult medical examination without abnormal findings: Secondary | ICD-10-CM | POA: Diagnosis not present

## 2018-11-21 DIAGNOSIS — Z23 Encounter for immunization: Secondary | ICD-10-CM | POA: Diagnosis not present

## 2018-11-21 DIAGNOSIS — M810 Age-related osteoporosis without current pathological fracture: Secondary | ICD-10-CM | POA: Diagnosis not present

## 2018-11-21 DIAGNOSIS — E039 Hypothyroidism, unspecified: Secondary | ICD-10-CM | POA: Diagnosis not present

## 2018-12-10 ENCOUNTER — Ambulatory Visit
Admission: RE | Admit: 2018-12-10 | Discharge: 2018-12-10 | Disposition: A | Discharge status: Home / Self Care | Payer: Medicare Other | Source: Ambulatory Visit | Attending: Internal Medicine | Admitting: Internal Medicine

## 2018-12-10 ENCOUNTER — Other Ambulatory Visit: Payer: Self-pay

## 2018-12-10 DIAGNOSIS — Z1231 Encounter for screening mammogram for malignant neoplasm of breast: Secondary | ICD-10-CM | POA: Diagnosis not present

## 2019-01-08 NOTE — Progress Notes (Signed)
Chief Complaint  Patient presents with  . Follow-up    CAD   History of Present Illness: 85 yo female with history of CAD, HTN, and HLD here today for cardiac follow up. She had an anterolateral MI in 2001 with stent placement in the Diagonal branch of the LAD. I met her in 2014. Stress myoview 12/11/12 with large scar in the anterior wall. Cardiac cath 12/18/12 with mild to moderate disease in the diagonal, Circumflex and RCA. She was seen in the ED January 2017 with dyspnea and palpitations. Troponin negative and EKG without ischemic changes. Follow up here in our office with Estella Husk, PA-C 04/06/15 and echo, stress test and cardiac monitor arranged. Echo 04/19/15 with normal LV systolic function, mild diastolic dysfunction. Nuclear stress test 04/19/15 with scar but no ischemia. Cardiac monitor 2017 with sinus, PACs, PVCs.   She is here today for follow up. The patient denies any chest pain, dyspnea, palpitations, lower extremity edema, orthopnea, PND, dizziness, near syncope or syncope.    Primary Care Physician: Wenda Low, MD  Past Medical History:  Diagnosis Date  . Coronary artery disease    had diffuse three-vessel coronary disease without focal stenosis at that time -- Last cath was in 2007showed anterior wall hypokinesis with well-preserved apical and inferior wall motion with and EF of approximately 45% -- last cardiolite study 12/09 this showed an EF of 47% with anterior akinesis, anterior scar , & no ischemia general  . Fatigue   . History of acute anterior wall myocardial infarction 04/25/1999   with a stent placement in her first diagonal vessel, and subsequent cutting balloon to the diagonal in 2001  . History of shingles   . Hypercholesterolemia   . Hypertension   . Weakness     Past Surgical History:  Procedure Laterality Date  . ABDOMINAL HYSTERECTOMY    . BREAST BIOPSY     Bilateral  . BREAST EXCISIONAL BIOPSY Bilateral   . CHOLECYSTECTOMY    . LEFT  HEART CATHETERIZATION WITH CORONARY ANGIOGRAM N/A 12/18/2012   Procedure: LEFT HEART CATHETERIZATION WITH CORONARY ANGIOGRAM;  Surgeon: Burnell Blanks, MD;  Location: [facility] CATH LAB;  Service: Cardiovascular;  Laterality: N/A;    Current Outpatient Medications  Medication Sig Dispense Refill  . aspirin 81 MG tablet Take 81 mg by mouth daily. As needed for pain    . calcium carbonate (OS-CAL) 600 MG TABS Take 600 mg by mouth daily.      . isosorbide mononitrate (IMDUR) 30 MG 24 hr tablet TAKE 1/2 TABLET DAILY 45 tablet 1  . KLOR-CON M20 20 MEQ tablet TAKE 1 TABLET BY MOUTH EVERY DAY 90 tablet 3  . losartan-hydrochlorothiazide (HYZAAR) 100-12.5 MG tablet Take 1 tablet by mouth daily. Please keep upcoming appt in October with Dr. Angelena Form for future refills. Thank you 90 tablet 0  . metoprolol succinate (TOPROL-XL) 50 MG 24 hr tablet TAKE 1 TABLET BY MOUTH EVERY DAY 90 tablet 1  . nitroGLYCERIN (NITROSTAT) 0.4 MG SL tablet PLACE 1 TABLET (0.4 MG TOTAL) UNDER THE TONGUE EVERY 5 (FIVE) MINUTES AS NEEDED FOR CHEST PAIN. 25 tablet 5  . simvastatin (ZOCOR) 40 MG tablet TAKE 1/2 TABLET BY MOUTH AT BEDTIME 45 tablet 2   No current facility-administered medications for this visit.     Allergies  Allergen Reactions  . Actonel [Risedronate Sodium] Other (See Comments)    CHEST PAIN/COUGH  . Altace [Ramipril] Other (See Comments)    CHEST PAIN/COUGH    Social  History   Socioeconomic History  . Marital status: Married    Spouse name: Not on file  . Number of children: 3  . Years of education: Not on file  . Highest education level: Not on file  Occupational History  . Occupation: Geographical information systems officer: RETIRED  Social Needs  . Financial resource strain: Not on file  . Food insecurity    Worry: Not on file    Inability: Not on file  . Transportation needs    Medical: Not on file    Non-medical: Not on file  Tobacco Use  . Smoking status: Never Smoker  . Smokeless  tobacco: Never Used  Substance and Sexual Activity  . Alcohol use: No    Alcohol/week: 0.0 standard drinks  . Drug use: No  . Sexual activity: Not on file  Lifestyle  . Physical activity    Days per week: Not on file    Minutes per session: Not on file  . Stress: Not on file  Relationships  . Social Herbalist on phone: Not on file    Gets together: Not on file    Attends religious service: Not on file    Active member of club or organization: Not on file    Attends meetings of clubs or organizations: Not on file    Relationship status: Not on file  . Intimate partner violence    Fear of current or ex partner: Not on file    Emotionally abused: Not on file    Physically abused: Not on file    Forced sexual activity: Not on file  Other Topics Concern  . Not on file  Social History Narrative  . Not on file    Family History  Problem Relation Age of Onset  . Liver disease Mother   . Heart disease Father   . Dementia Sister   . Heart disease Brother   . Healthy Sister     Review of Systems:  As stated in the HPI and otherwise negative.   BP 124/62   Pulse 64   Ht _0  (1.6 m)   Wt 109 lb (49.4 kg)   SpO2 97%   BMI 19.31 kg/m   Physical Examination: General: Well developed, well nourished, NAD  HEENT: OP clear, mucus membranes moist  SKIN: warm, dry. No rashes. Neuro: No focal deficits  Musculoskeletal: Muscle strength 5/5 all ext  Psychiatric: Mood and affect normal  Neck: No JVD, no carotid bruits, no thyromegaly, no lymphadenopathy.  Lungs:Clear bilaterally, no wheezes, rhonci, crackles Cardiovascular: Regular rate and rhythm. No murmurs, gallops or rubs. Abdomen:Soft. Bowel sounds present. Non-tender.  Extremities: No lower extremity edema. Pulses are 2 + in the bilateral DP/PT.  Cardiac cath 12/18/12: Left main: No obstructive disease.  Left Anterior Descending Artery: Moderate caliber vessel that courses to the apex. The vessel becomes very  small in caliber at the apex. There are no obstructive lesions in the LAD. The diagonal branch is moderate in caliber with a patent stent. There is diffuse 40% restenosis within the stent. This does not appear to be flow limiting. Just beyond the stent there is a 40% stenosis.  Circumflex Artery: Large caliber vessel that continues as a large obtuse marginal branch. The proximal Circumflex has a focal 40% stenosis. The ostium of the first OM branch has 30% stenosis. The continuation of the AV groove Circumflex beyond the takeoff of the OM branch is small in caliber.  Right Coronary Artery: Large dominant vessel with diffuse 40% mid stenosis. There is mild disease in the posterolateral branch and PDA.  Left Ventricular Angiogram: LVEF=40%, hypokinesis of the anterior wall, apex.  Impression:  1. Moderate non-obstructive CAD  2. Moderate segmental LV systolic dysfunction  Recommendations: Continue medical management of CAD.   Echo 04/19/15: Left ventricle: The cavity size was normal. There was mild focal basal hypertrophy of the septum. Systolic function was normal. The estimated ejection fraction was in the range of 50% to 55%. Doppler parameters are consistent with abnormal left ventricular relaxation (grade 1 diastolic dysfunction). The E/e&' ratio is between 8-15, suggesting indeterminate LV filling pressure. - Mitral valve: Mildly thickened leaflets . There was trivial regurgitation. - Tricuspid valve: There was mild regurgitation. - Pulmonary arteries: PA peak pressure: 32 mm Hg (S) + RAP. - Systemic veins: IVC not visualized. Impressions:  - Technically difficult study. LVEF 50-55%, no clear regional wall motion abnormality, mild proximal septal thickening, diastolic dysfunction with indeterminate LV filling pressure, normal atrial size, mild TR, RVSP 32 mmHg + RAP  EKG:  EKG is  ordered today. The EKG demonstrates Sinus, rate 64 bpm. PVC. Incomplete RBBB.    Recent Labs: No results found for requested labs within last 8760 hours.   Lipid Panel Followed in primary care   Wt Readings from Last 3 Encounters:  01/09/19 109 lb (49.4 kg)  01/09/18 116 lb 6.4 oz (52.8 kg)  07/09/17 115 lb 6.4 oz (52.3 kg)     Other studies Reviewed: Additional studies/ records that were reviewed today include: . Review of the above records demonstrates:    Assessment and Plan:   1. CAD with stable angina: She has no chest pain. Last cath 12/18/12 with stable mild to moderate CAD. Nuclear stress test 04/19/15 with no ischemia. LV function is normal by echo in 2017. Continue ASA, Imdur, statin and beta blocker.     2. HTN: BP is well controlled. No changes today  3. Hyperlipidemia: Lipids followed in primary care. Will continue statin  4. Premature ventricular contractions: No palpitations. Continue beta blocker    Current medicines are reviewed at length with the patient today.  The patient does not have concerns regarding medicines.  The following changes have been made:  no change  Labs/ tests ordered today include:   Orders Placed This Encounter  Procedures  . EKG 12-Lead    Disposition:   FU with me in 12 months  Signed, Lauree Chandler, MD 01/09/2019 10:58 AM    [facility], [place], [place]  [address] Phone: [phone]; Fax: [phone]

## 2019-01-09 ENCOUNTER — Encounter: Payer: Self-pay | Admitting: Cardiovascular Disease

## 2019-01-09 ENCOUNTER — Other Ambulatory Visit: Payer: Self-pay

## 2019-01-09 ENCOUNTER — Ambulatory Visit (INDEPENDENT_AMBULATORY_CARE_PROVIDER_SITE_OTHER): Payer: Medicare Other | Admitting: Cardiovascular Disease

## 2019-01-09 VITALS — BP 124/62 | HR 64 | Ht 63.0 in | Wt 109.0 lb

## 2019-01-09 DIAGNOSIS — I493 Ventricular premature depolarization: Secondary | ICD-10-CM | POA: Diagnosis not present

## 2019-01-09 DIAGNOSIS — I10 Essential (primary) hypertension: Secondary | ICD-10-CM

## 2019-01-09 DIAGNOSIS — E7800 Pure hypercholesterolemia, unspecified: Secondary | ICD-10-CM

## 2019-01-09 DIAGNOSIS — I25119 Atherosclerotic heart disease of native coronary artery with unspecified angina pectoris: Secondary | ICD-10-CM

## 2019-01-09 NOTE — Patient Instructions (Signed)

## 2019-01-15 DIAGNOSIS — M25562 Pain in left knee: Secondary | ICD-10-CM | POA: Diagnosis not present

## 2019-01-15 DIAGNOSIS — M1712 Unilateral primary osteoarthritis, left knee: Secondary | ICD-10-CM | POA: Diagnosis not present

## 2019-01-21 ENCOUNTER — Other Ambulatory Visit: Payer: Self-pay | Admitting: Cardiovascular Disease

## 2019-01-31 ENCOUNTER — Other Ambulatory Visit: Payer: Self-pay

## 2019-01-31 ENCOUNTER — Ambulatory Visit
Admission: RE | Admit: 2019-01-31 | Discharge: 2019-01-31 | Disposition: A | Discharge status: Home / Self Care | Payer: Medicare Other | Source: Ambulatory Visit | Attending: Internal Medicine | Admitting: Internal Medicine

## 2019-01-31 DIAGNOSIS — Z780 Asymptomatic menopausal state: Secondary | ICD-10-CM | POA: Diagnosis not present

## 2019-01-31 DIAGNOSIS — M8580 Other specified disorders of bone density and structure, unspecified site: Secondary | ICD-10-CM

## 2019-01-31 DIAGNOSIS — M8589 Other specified disorders of bone density and structure, multiple sites: Secondary | ICD-10-CM | POA: Diagnosis not present

## 2019-03-04 ENCOUNTER — Other Ambulatory Visit: Payer: Self-pay | Admitting: Cardiovascular Disease

## 2019-04-01 DIAGNOSIS — Z23 Encounter for immunization: Secondary | ICD-10-CM | POA: Diagnosis not present

## 2019-04-12 ENCOUNTER — Other Ambulatory Visit: Payer: Self-pay | Admitting: Cardiovascular Disease

## 2019-04-12 ENCOUNTER — Ambulatory Visit: Payer: Medicare Other

## 2019-04-24 ENCOUNTER — Other Ambulatory Visit: Payer: Self-pay | Admitting: Cardiovascular Disease

## 2019-05-12 DIAGNOSIS — Z23 Encounter for immunization: Secondary | ICD-10-CM | POA: Diagnosis not present

## 2019-05-14 DIAGNOSIS — M25562 Pain in left knee: Secondary | ICD-10-CM | POA: Diagnosis not present

## 2019-05-27 DIAGNOSIS — M25562 Pain in left knee: Secondary | ICD-10-CM | POA: Diagnosis not present

## 2019-06-14 ENCOUNTER — Other Ambulatory Visit: Payer: Self-pay | Admitting: Cardiovascular Disease

## 2019-08-13 DIAGNOSIS — H26493 Other secondary cataract, bilateral: Secondary | ICD-10-CM | POA: Diagnosis not present

## 2019-08-13 DIAGNOSIS — H43813 Vitreous degeneration, bilateral: Secondary | ICD-10-CM | POA: Diagnosis not present

## 2019-08-13 DIAGNOSIS — H524 Presbyopia: Secondary | ICD-10-CM | POA: Diagnosis not present

## 2019-08-13 DIAGNOSIS — H04123 Dry eye syndrome of bilateral lacrimal glands: Secondary | ICD-10-CM | POA: Diagnosis not present

## 2019-09-10 ENCOUNTER — Other Ambulatory Visit: Payer: Self-pay | Admitting: Cardiovascular Disease

## 2019-09-12 ENCOUNTER — Other Ambulatory Visit: Payer: Self-pay

## 2019-09-12 ENCOUNTER — Encounter: Payer: Self-pay | Admitting: Podiatry

## 2019-09-12 ENCOUNTER — Ambulatory Visit (INDEPENDENT_AMBULATORY_CARE_PROVIDER_SITE_OTHER): Payer: Medicare Other | Admitting: Podiatry

## 2019-09-12 DIAGNOSIS — Q828 Other specified congenital malformations of skin: Secondary | ICD-10-CM

## 2019-09-12 DIAGNOSIS — M79671 Pain in right foot: Secondary | ICD-10-CM | POA: Diagnosis not present

## 2019-09-12 NOTE — Progress Notes (Signed)
Subjective:   Patient ID: [name], female   DOB: 86 y.o.   MRN: [id number]   HPI 86 year old female presents with her husband for concerns of a painful skin lesion of the ball of her right foot, pointing to submetatarsal 1.  She states the area is very tender and hurts to walk.  Denies any drainage or pus or any swelling or redness.  She had a cut out several years ago.  No other concerns today.   Review of Systems  All other systems reviewed and are negative.   Past Medical History:  Diagnosis Date  . Coronary artery disease    had diffuse three-vessel coronary disease without focal stenosis at that time -- Last cath was in 2007showed anterior wall hypokinesis with well-preserved apical and inferior wall motion with and EF of approximately 45% -- last cardiolite study 12/09 this showed an EF of 47% with anterior akinesis, anterior scar , & no ischemia general  . Fatigue   . History of acute anterior wall myocardial infarction 04/25/1999   with a stent placement in her first diagonal vessel, and subsequent cutting balloon to the diagonal in 2001  . History of shingles   . Hypercholesterolemia   . Hypertension   . Weakness     Past Surgical History:  Procedure Laterality Date  . ABDOMINAL HYSTERECTOMY    . BREAST BIOPSY     Bilateral  . BREAST EXCISIONAL BIOPSY Bilateral   . CHOLECYSTECTOMY    . LEFT HEART CATHETERIZATION WITH CORONARY ANGIOGRAM N/A 12/18/2012   Procedure: LEFT HEART CATHETERIZATION WITH CORONARY ANGIOGRAM;  Surgeon: Burnell Blanks, MD;  Location: [facility] CATH LAB;  Service: Cardiovascular;  Laterality: N/A;     Current Outpatient Medications:  .  aspirin 81 MG tablet, Take 81 mg by mouth daily. As needed for pain, Disp: , Rfl:  .  calcium carbonate (OS-CAL) 600 MG TABS, Take 600 mg by mouth daily.  , Disp: , Rfl:  .  isosorbide mononitrate (IMDUR) 30 MG 24 hr tablet, TAKE 1/2 TABLET BY MOUTH EVERY DAY, Disp: 45 tablet, Rfl: 2 .  KLOR-CON M20 20  MEQ tablet, TAKE 1 TABLET BY MOUTH EVERY DAY, Disp: 90 tablet, Rfl: 3 .  losartan-hydrochlorothiazide (HYZAAR) 100-12.5 MG tablet, TAKE 1 TABLET BY MOUTH EVERY DAY, Disp: 90 tablet, Rfl: 1 .  metoprolol succinate (TOPROL-XL) 50 MG 24 hr tablet, TAKE 1 TABLET BY MOUTH EVERY DAY, Disp: 90 tablet, Rfl: 1 .  nitroGLYCERIN (NITROSTAT) 0.4 MG SL tablet, PLACE 1 TABLET (0.4 MG TOTAL) UNDER THE TONGUE EVERY 5 (FIVE) MINUTES AS NEEDED FOR CHEST PAIN., Disp: 25 tablet, Rfl: 5 .  simvastatin (ZOCOR) 40 MG tablet, TAKE 1/2 TABLET BY MOUTH EVERY DAY AT BEDTIME, Disp: 45 tablet, Rfl: 2  Allergies  Allergen Reactions  . Actonel [Risedronate Sodium] Other (See Comments)    CHEST PAIN/COUGH  . Altace [Ramipril] Other (See Comments)    CHEST PAIN/COUGH        Objective:  Physical Exam  General: AAO x3, NAD  Dermatological: Thick hyperkeratotic lesion right foot submetatarsal 1.  Upon debridement there is no underlying ulceration drainage or any signs of infection.  No other open lesions are identified today.  Vascular: Dorsalis Pedis artery and Posterior Tibial artery pedal pulses are 2/4 bilateral with immedate capillary fill time.  There is no pain with calf compression, swelling, warmth, erythema.   Neruologic: Grossly intact via light touch bilateral.   Musculoskeletal: Tenderness of the hyperkeratotic lesion but no other  areas of tenderness identified today.  Muscular strength 5/5 in all groups tested bilateral.  Gait: Unassisted, Nonantalgic.       Assessment:   Symptomatic porokeratosis right foot    Plan:  -Treatment options discussed including all alternatives, risks, and complications -Etiology of symptoms were discussed -Hyperkeratotic lesion sharply debrided x1 without any complications or bleeding.  I applied lidocaine cream prior to debridement.  Dispensed offloading pads.  RTC 3-6 months or as needed  Trula Slade DPM

## 2019-10-30 ENCOUNTER — Other Ambulatory Visit: Payer: Self-pay | Admitting: Cardiovascular Disease

## 2019-11-13 DIAGNOSIS — M25552 Pain in left hip: Secondary | ICD-10-CM | POA: Diagnosis not present

## 2019-11-13 DIAGNOSIS — M7062 Trochanteric bursitis, left hip: Secondary | ICD-10-CM | POA: Diagnosis not present

## 2019-11-27 DIAGNOSIS — Z0000 Encounter for general adult medical examination without abnormal findings: Secondary | ICD-10-CM | POA: Diagnosis not present

## 2019-11-27 DIAGNOSIS — E7800 Pure hypercholesterolemia, unspecified: Secondary | ICD-10-CM | POA: Diagnosis not present

## 2019-11-27 DIAGNOSIS — I2510 Atherosclerotic heart disease of native coronary artery without angina pectoris: Secondary | ICD-10-CM | POA: Diagnosis not present

## 2019-11-27 DIAGNOSIS — N183 Chronic kidney disease, stage 3 unspecified: Secondary | ICD-10-CM | POA: Diagnosis not present

## 2019-11-27 DIAGNOSIS — Z23 Encounter for immunization: Secondary | ICD-10-CM | POA: Diagnosis not present

## 2019-11-27 DIAGNOSIS — Z1389 Encounter for screening for other disorder: Secondary | ICD-10-CM | POA: Diagnosis not present

## 2019-11-27 DIAGNOSIS — E039 Hypothyroidism, unspecified: Secondary | ICD-10-CM | POA: Diagnosis not present

## 2019-11-27 DIAGNOSIS — I252 Old myocardial infarction: Secondary | ICD-10-CM | POA: Diagnosis not present

## 2019-11-27 DIAGNOSIS — M8580 Other specified disorders of bone density and structure, unspecified site: Secondary | ICD-10-CM | POA: Diagnosis not present

## 2019-11-27 DIAGNOSIS — I10 Essential (primary) hypertension: Secondary | ICD-10-CM | POA: Diagnosis not present

## 2019-12-19 DIAGNOSIS — Z1231 Encounter for screening mammogram for malignant neoplasm of breast: Secondary | ICD-10-CM | POA: Diagnosis not present

## 2019-12-19 DIAGNOSIS — Z01419 Encounter for gynecological examination (general) (routine) without abnormal findings: Secondary | ICD-10-CM | POA: Diagnosis not present

## 2019-12-24 ENCOUNTER — Other Ambulatory Visit: Payer: Self-pay | Admitting: Cardiovascular Disease

## 2019-12-26 ENCOUNTER — Ambulatory Visit (INDEPENDENT_AMBULATORY_CARE_PROVIDER_SITE_OTHER): Payer: Medicare Other | Admitting: Podiatry

## 2019-12-26 ENCOUNTER — Other Ambulatory Visit: Payer: Self-pay

## 2019-12-26 DIAGNOSIS — L989 Disorder of the skin and subcutaneous tissue, unspecified: Secondary | ICD-10-CM

## 2019-12-29 NOTE — Progress Notes (Signed)
Subjective: 86 year old female presents the office today for recurrent skin lesion which is painful in the right foot submetatarsal 1.  She denies any drainage or pus any swelling or redness.  She has no recent injury or trauma since I last saw her with no new concerns. Denies any systemic complaints such as fevers, chills, nausea, vomiting. No acute changes since last appointment, and no other complaints at this time.   Objective: AAO x3, NAD DP/PT pulses palpable bilaterally, CRT less than 3 seconds Thick hyperkeratotic lesion left foot submetatarsal 1 on the right side.  No underlying ulceration drainage or signs of infection no evidence of verruca or foreign body.  Prominence of the sesamoids. No pain with calf compression, swelling, warmth, erythema  Assessment: Porokeratosis, benign skin lesion right foot  Plan: -All treatment options discussed with the patient including all alternatives, risks, complications.  -Lesion was anesthetized with lidocaine gel.  Skin was cleaned with alcohol then the lesion was then debrided utilizing the 312 scalpel to any complications.  Very minimal bleeding occurred.  Cleaned with alcohol and antibiotic ointment was applied.  Monitor for any signs or symptoms of infection.  Offloading pads dispensed. -Patient encouraged to call the office with any questions, concerns, change in symptoms.   Trula Slade DPM

## 2020-01-07 DIAGNOSIS — F411 Generalized anxiety disorder: Secondary | ICD-10-CM | POA: Diagnosis not present

## 2020-01-09 ENCOUNTER — Other Ambulatory Visit: Payer: Self-pay | Admitting: Cardiovascular Disease

## 2020-01-24 ENCOUNTER — Other Ambulatory Visit: Payer: Self-pay | Admitting: Cardiovascular Disease

## 2020-01-24 DIAGNOSIS — Z23 Encounter for immunization: Secondary | ICD-10-CM | POA: Diagnosis not present

## 2020-02-01 NOTE — Progress Notes (Signed)
Cardiology Office Note   Date:  02/05/2020   ID:  Alexa, Pearson 1932-07-19, MRN [id number]  PCP:  Wenda Low, MD  Cardiologist: Dr. Angelena Form, MD   Chief Complaint  Patient presents with  . Follow-up    History of Present Illness: [name] is a 86 y.o. female who presents for 1 year follow-up, seen for Dr. Angelena Form.  [name] has a history of CAD, HTN and HLD. She had a anterior lateral MI in 2001 with PCI to the diagonal branch of the LAD.  She had a stress test 12/11/2012 with a large scar in the anterior wall.  Repeat LHC 12/18/2012 with mild to moderate disease in the diagonal, LCx and RCA territory.  She had recurrent chest pain 03/2015 at which time hospital work-up was essentially negative with follow-up in the office for echocardiogram, stress test and cardiac monitor.  Echo 04/19/2015 with normal LV function, mild diastolic dysfunction.  Stress test 04/19/2015 with scar but no ischemia and a cardiac monitor with NSR, PACs and PVCs.  She was most recently seen by Dr. Angelena Form 01/09/2019 for follow-up at which time she was doing well from a CV standpoint with no specific complaints.  Today she reports that she is doing well from a CV standpoint. She denies chest pain, SOB, LE edema, palpitations, PND, or syncope. She was recently seen by her PCP who started her on low dose Lexapro for anxiety which she says is helping. She is asking if its ok to take with her other medications. She has been staying safe from [place] although reports loosing several church members. Reports that she has lost a little weight but states that she is more active and appetite is not quite what it used to be. She has lost 7lbs since last seen 12/2018.   Past Medical History:  Diagnosis Date  . Coronary artery disease    had diffuse three-vessel coronary disease without focal stenosis at that time -- Last cath was in 2007showed anterior wall hypokinesis with well-preserved apical  and inferior wall motion with and EF of approximately 45% -- last cardiolite study 12/09 this showed an EF of 47% with anterior akinesis, anterior scar , & no ischemia general  . Fatigue   . History of acute anterior wall myocardial infarction 04/25/1999   with a stent placement in her first diagonal vessel, and subsequent cutting balloon to the diagonal in 2001  . History of shingles   . Hypercholesterolemia   . Hypertension   . Weakness     Past Surgical History:  Procedure Laterality Date  . ABDOMINAL HYSTERECTOMY    . BREAST BIOPSY     Bilateral  . BREAST EXCISIONAL BIOPSY Bilateral   . CHOLECYSTECTOMY    . LEFT HEART CATHETERIZATION WITH CORONARY ANGIOGRAM N/A 12/18/2012   Procedure: LEFT HEART CATHETERIZATION WITH CORONARY ANGIOGRAM;  Surgeon: Burnell Blanks, MD;  Location[facility] CATH LAB;  Service: Cardiovascular;  Laterality: N/A;     Current Outpatient Medications  Medication Sig Dispense Refill  . aspirin 81 MG tablet Take 81 mg by mouth daily. As needed for pain    . calcium carbonate (OS-CAL) 600 MG TABS Take 600 mg by mouth daily.      Marland Kitchen escitalopram (LEXAPRO) 5 MG tablet Take 5 mg by mouth daily.    . isosorbide mononitrate (IMDUR) 30 MG 24 hr tablet TAKE 1/2 TABLET BY MOUTH EVERY DAY 45 tablet 0  . KLOR-CON M20 20 MEQ tablet TAKE  1 TABLET BY MOUTH EVERY DAY 90 tablet 3  . losartan-hydrochlorothiazide (HYZAAR) 100-12.5 MG tablet TAKE 1 TABLET BY MOUTH EVERY DAY 90 tablet 1  . metoprolol succinate (TOPROL-XL) 50 MG 24 hr tablet Take 1 tablet (50 mg total) by mouth daily. Please keep upcoming appt in November for future refills. Thank you 30 tablet 0  . nitroGLYCERIN (NITROSTAT) 0.4 MG SL tablet PLACE 1 TABLET (0.4 MG TOTAL) UNDER THE TONGUE EVERY 5 (FIVE) MINUTES AS NEEDED FOR CHEST PAIN. 25 tablet 5  . simvastatin (ZOCOR) 40 MG tablet TAKE 1/2 TABLET BY MOUTH EVERY DAY AT BEDTIME 45 tablet 2   No current facility-administered medications for this visit.     Allergies:   Actonel [risedronate sodium] and Altace [ramipril]    Social History:  The patient  reports that she has never smoked. She has never used smokeless tobacco. She reports that she does not drink alcohol and does not use drugs.   Family History:  [name] history includes Dementia in her sister; Healthy in her sister; Heart disease in her brother and father; Liver disease in her mother.    ROS:  Please see the history of present illness. Otherwise, review of systems are positive for none.   All other systems are reviewed and negative.    PHYSICAL EXAM: VS:  BP (!) 118/58   Pulse (!) 57   Ht 5\' 3"  (1.6 m)   Wt 102 lb (46.3 kg)   SpO2 98%   BMI 18.07 kg/m  , BMI Body mass index is 18.07 kg/m.   General: Elderly, NAD Skin: Warm, dry, intact  Lungs:Clear to ausculation bilaterally. No wheezes, rales, or rhonchi. Breathing is unlabored. Cardiovascular: RRR with S1 S2. No murmur Extremities: No edema. Radial pulses 2+ bilaterally Neuro: Alert and oriented. No focal deficits. No facial asymmetry. MAE spontaneously. Psych: Responds to questions appropriately with normal affect.     EKG:  EKG is ordered today. The ekg ordered today demonstrates NSR with one PVC, PAC, HR 57bpm   Recent Labs: No results found for requested labs within last 8760 hours.    Lipid Panel    Component Value Date/Time   CHOL 153 12/11/2012 0804   TRIG 133.0 12/11/2012 0804   HDL 49.10 12/11/2012 0804   CHOLHDL 3 12/11/2012 0804   VLDL 26.6 12/11/2012 0804   LDLCALC 77 12/11/2012 0804     Wt Readings from Last 3 Encounters:  02/05/20 102 lb (46.3 kg)  01/09/19 109 lb (49.4 kg)  01/09/18 116 lb 6.4 oz (52.8 kg)    Other studies Reviewed: Additional studies/ records that were reviewed today include:  Review of the above records demonstrates:   Cardiac cath 12/18/12: Left main: No obstructive disease.  Left Anterior Descending Artery: Moderate caliber vessel that courses to  the apex. The vessel becomes very small in caliber at the apex. There are no obstructive lesions in the LAD. The diagonal branch is moderate in caliber with a patent stent. There is diffuse 40% restenosis within the stent. This does not appear to be flow limiting. Just beyond the stent there is a 40% stenosis.  Circumflex Artery: Large caliber vessel that continues as a large obtuse marginal branch. The proximal Circumflex has a focal 40% stenosis. The ostium of the first OM branch has 30% stenosis. The continuation of the AV groove Circumflex beyond the takeoff of the OM branch is small in caliber.  Right Coronary Artery: Large dominant vessel with diffuse 40% mid stenosis. There is  mild disease in the posterolateral branch and PDA.  Left Ventricular Angiogram: LVEF=40%, hypokinesis of the anterior wall, apex.  Impression:  1. Moderate non-obstructive CAD  2. Moderate segmental LV systolic dysfunction  Recommendations: Continue medical management of CAD.   Echo 04/19/15: Left ventricle: The cavity size was normal. There was mild focal basal hypertrophy of the septum. Systolic function was normal. The estimated ejection fraction was in the range of 50% to 55%. Doppler parameters are consistent with abnormal left ventricular relaxation (grade 1 diastolic dysfunction). The E/e&' ratio is between 8-15, suggesting indeterminate LV filling pressure. - Mitral valve: Mildly thickened leaflets . There was trivial regurgitation. - Tricuspid valve: There was mild regurgitation. - Pulmonary arteries: PA peak pressure: 32 mm Hg (S) + RAP. - Systemic veins: IVC not visualized. Impressions:  - Technically difficult study. LVEF 50-55%, no clear regional wall motion abnormality, mild proximal septal thickening, diastolic dysfunction with indeterminate LV filling pressure, normal atrial size, mild TR, RVSP 32 mmHg + RAP  ASSESSMENT AND PLAN:  1.  CAD without angina: -Last cath  12/18/2012 was stable mild to moderate CAD -Nuclear stress test 04/19/2015 with no ischemia and echocardiogram at that time with normal LV function -Denies angina -Continue ASA, statin, beta blocker  2.  HTN: -Stable,118/58 -Continue current regimen   3.  HLD:  -Lipids followed by PCP -Continue statin although reports she may be interested in transitioning to Crestor or atorvastatin due to drug interactions with simvastatin    4.  PVCs:  -Denies palpitations -EKG with PVC today  -Continue beta-blocker  5. Weight loss: -Reports weight loss over the last year>>7lb -States that she remains very active but appetite is lower -To watch closely  -No GI symptoms>>follows closely with PCP   Current medicines are reviewed at length with the patient today.  The patient does not have concerns regarding medicines.  The following changes have been made:  no change  Labs/ tests ordered today include: None   Orders Placed This Encounter  Procedures  . EKG 12-Lead    Disposition:   FU with Dr. Angelena Form in 1 year  Signed, Kathyrn Drown, NP  02/05/2020 10:40 AM    [address], [place], [place]  [address] Phone: [phone]; Fax: [phone]

## 2020-02-05 ENCOUNTER — Ambulatory Visit (INDEPENDENT_AMBULATORY_CARE_PROVIDER_SITE_OTHER): Payer: Medicare Other | Admitting: Cardiology

## 2020-02-05 ENCOUNTER — Other Ambulatory Visit: Payer: Self-pay

## 2020-02-05 ENCOUNTER — Encounter: Payer: Self-pay | Admitting: Cardiology

## 2020-02-05 VITALS — BP 118/58 | HR 57 | Ht 63.0 in | Wt 102.0 lb

## 2020-02-05 DIAGNOSIS — I10 Essential (primary) hypertension: Secondary | ICD-10-CM | POA: Diagnosis not present

## 2020-02-05 DIAGNOSIS — I25119 Atherosclerotic heart disease of native coronary artery with unspecified angina pectoris: Secondary | ICD-10-CM | POA: Diagnosis not present

## 2020-02-05 DIAGNOSIS — I493 Ventricular premature depolarization: Secondary | ICD-10-CM | POA: Diagnosis not present

## 2020-02-05 DIAGNOSIS — E7800 Pure hypercholesterolemia, unspecified: Secondary | ICD-10-CM

## 2020-02-05 NOTE — Patient Instructions (Signed)
Medication Instructions:  Your physician recommends that you continue on your current medications as directed. Please refer to the Current Medication list given to you today.  *If you need a refill on your cardiac medications before your next appointment, please call your pharmacy*   Lab Work: NONE If you have labs (blood work) drawn today and your tests are completely normal, you will receive your results only by: MyChart Message (if you have MyChart) OR A paper copy in the mail If you have any lab test that is abnormal or we need to change your treatment, we will call you to review the results.   Testing/Procedures: NONE   Follow-Up: At [facility], you and your health needs are our priority.  As part of our continuing mission to provide you with exceptional heart care, we have created designated Provider Care Teams.  These Care Teams include your primary Cardiologist (physician) and Advanced Practice Providers (APPs -  Physician Assistants and Nurse Practitioners) who all work together to provide you with the care you need, when you need it.  We recommend signing up for the patient portal called "MyChart".  Sign up information is provided on this After Visit Summary.  MyChart is used to connect with patients for Virtual Visits (Telemedicine).  Patients are able to view lab/test results, encounter notes, upcoming appointments, etc.  Non-urgent messages can be sent to your provider as well.   To learn more about what you can do with MyChart, go to [email or web address].    Your next appointment:   1 year(s)  The format for your next appointment:   In Person  Provider:   You may see Christopher McAlhany, MD or one of the following Advanced Practice Providers on your designated Care Team:   Dayna Dunn, PA-C Michele Lenze, PA-C    

## 2020-02-18 DIAGNOSIS — F411 Generalized anxiety disorder: Secondary | ICD-10-CM | POA: Diagnosis not present

## 2020-02-20 ENCOUNTER — Other Ambulatory Visit: Payer: Self-pay | Admitting: Cardiovascular Disease

## 2020-03-04 ENCOUNTER — Other Ambulatory Visit: Payer: Self-pay | Admitting: Cardiovascular Disease

## 2020-03-05 ENCOUNTER — Ambulatory Visit: Payer: Medicare Other | Admitting: Podiatry

## 2020-03-20 ENCOUNTER — Other Ambulatory Visit: Payer: Self-pay | Admitting: Cardiovascular Disease

## 2020-03-24 ENCOUNTER — Telehealth: Payer: Self-pay | Admitting: Cardiovascular Disease

## 2020-03-24 MED ORDER — POTASSIUM CHLORIDE CRYS ER 20 MEQ PO TBCR: 20.0000 meq | EXTENDED_RELEASE_TABLET | Freq: Every day | ORAL | 3 refills | Status: DC

## 2020-03-24 MED ORDER — ISOSORBIDE MONONITRATE ER 30 MG PO TB24
15.0000 mg | ORAL_TABLET | Freq: Every day | ORAL | 3 refills | Status: DC
Start: 2020-03-24 — End: 2021-03-31

## 2020-03-24 NOTE — Addendum Note (Signed)
Addended by: Margaret Pyle D on: 03/24/2020 03:01 PM   Modules accepted: Orders

## 2020-03-24 NOTE — Telephone Encounter (Signed)
Pt's medications were sent to pt's pharmacy as requested. Confirmation received.  

## 2020-03-24 NOTE — Telephone Encounter (Signed)
Pt c/o medication issue:  1. Name of Medication:  KLOR-CON M20 20 MEQ tablet isosorbide mononitrate (IMDUR) 30 MG 24 hr tablet  2. How are you currently taking this medication (dosage and times per day)? klor con 1 tablet daily, isosorbide half a tablet daily   3. Are you having a reaction (difficulty breathing--STAT)? no    4. What is your medication issue? Patient states the pharmacy won't refill the medications, but did not tell her why.

## 2020-04-02 ENCOUNTER — Ambulatory Visit: Payer: Medicare Other | Admitting: Podiatry

## 2020-04-09 ENCOUNTER — Ambulatory Visit: Payer: Medicare Other | Admitting: Podiatry

## 2020-04-23 ENCOUNTER — Ambulatory Visit (INDEPENDENT_AMBULATORY_CARE_PROVIDER_SITE_OTHER): Payer: 59 | Admitting: Podiatry

## 2020-04-23 ENCOUNTER — Other Ambulatory Visit: Payer: Self-pay

## 2020-04-23 DIAGNOSIS — M79675 Pain in left toe(s): Secondary | ICD-10-CM

## 2020-04-23 DIAGNOSIS — L989 Disorder of the skin and subcutaneous tissue, unspecified: Secondary | ICD-10-CM

## 2020-04-23 DIAGNOSIS — M79674 Pain in right toe(s): Secondary | ICD-10-CM | POA: Diagnosis not present

## 2020-04-23 DIAGNOSIS — B351 Tinea unguium: Secondary | ICD-10-CM

## 2020-04-28 NOTE — Progress Notes (Signed)
Subjective: 87 year old female presents the office today for recurrent skin lesion which is painful in the right foot submetatarsal 1 is also thick, elongated toes that she cannot trim himself.  Denies any open lesions.  Denies any swelling or redness. Denies any systemic complaints such as fevers, chills, nausea, vomiting. No acute changes since last appointment, and no other complaints at this time.   Objective: AAO x3, NAD DP/PT pulses palpable bilaterally, CRT less than 3 seconds Thick hyperkeratotic lesion left foot submetatarsal 1 on the right side.  No underlying ulceration drainage or signs of infection no evidence of verruca or foreign body.  Prominence of the sesamoids. Nails are hypertrophic, dystrophic, brittle, discolored, elongated 10. No surrounding redness or drainage. Tenderness nails 1-5 bilaterally.  No pain with calf compression, swelling, warmth, erythema  Assessment: Porokeratosis, benign skin lesion right foot; symptomatic onychomycosis  Plan: -All treatment options discussed with the patient including all alternatives, risks, complications.  -Lesion was anesthetized with lidocaine gel.  Skin was cleaned with alcohol then the lesion was then debrided utilizing the 312 scalpel to any complications or bleeding.  Monitor for any signs or symptoms of infection.  Offloading pads dispensed. -Nails debrided L29 without complications or bleeding -Patient encouraged to call the office with any questions, concerns, change in symptoms.   Trula Slade DPM

## 2020-06-26 ENCOUNTER — Other Ambulatory Visit: Payer: Self-pay | Admitting: Cardiovascular Disease

## 2020-07-23 ENCOUNTER — Other Ambulatory Visit: Payer: Self-pay

## 2020-07-23 ENCOUNTER — Ambulatory Visit (INDEPENDENT_AMBULATORY_CARE_PROVIDER_SITE_OTHER): Payer: 59 | Admitting: Podiatry

## 2020-07-23 ENCOUNTER — Encounter: Payer: Self-pay | Admitting: Podiatry

## 2020-07-23 DIAGNOSIS — M8580 Other specified disorders of bone density and structure, unspecified site: Secondary | ICD-10-CM | POA: Insufficient documentation

## 2020-07-23 DIAGNOSIS — Q828 Other specified congenital malformations of skin: Secondary | ICD-10-CM

## 2020-07-23 DIAGNOSIS — F419 Anxiety disorder, unspecified: Secondary | ICD-10-CM | POA: Insufficient documentation

## 2020-07-23 DIAGNOSIS — M79674 Pain in right toe(s): Secondary | ICD-10-CM | POA: Diagnosis not present

## 2020-07-23 DIAGNOSIS — E039 Hypothyroidism, unspecified: Secondary | ICD-10-CM | POA: Insufficient documentation

## 2020-07-23 DIAGNOSIS — B351 Tinea unguium: Secondary | ICD-10-CM | POA: Diagnosis not present

## 2020-07-23 DIAGNOSIS — E7800 Pure hypercholesterolemia, unspecified: Secondary | ICD-10-CM | POA: Insufficient documentation

## 2020-07-23 DIAGNOSIS — I252 Old myocardial infarction: Secondary | ICD-10-CM | POA: Insufficient documentation

## 2020-07-23 DIAGNOSIS — M79675 Pain in left toe(s): Secondary | ICD-10-CM

## 2020-07-23 DIAGNOSIS — N183 Chronic kidney disease, stage 3 unspecified: Secondary | ICD-10-CM | POA: Insufficient documentation

## 2020-07-23 DIAGNOSIS — M810 Age-related osteoporosis without current pathological fracture: Secondary | ICD-10-CM | POA: Insufficient documentation

## 2020-07-23 NOTE — Progress Notes (Signed)
Subjective: 87 year old female presents the office today for recurrent skin lesion which is painful in the right foot submetatarsal 1 is also thick, elongated toes that she cannot trim himself.  Denies any open lesions. Denies any systemic complaints such as fevers, chills, nausea, vomiting. No acute changes since last appointment, and no other complaints at this time.   Objective: AAO x3, NAD DP/PT pulses palpable bilaterally, CRT less than 3 seconds Thick hyperkeratotic lesion submetatarsal 1 on the right side.  No underlying ulceration drainage or signs of infection no evidence of verruca or foreign body.  Prominence of the sesamoids. Nails are hypertrophic, dystrophic, brittle, discolored, elongated 10. No surrounding redness or drainage. Tenderness nails 1-5 bilaterally.  No pain with calf compression, swelling, warmth, erythema  Assessment: Porokeratosis, benign skin lesion right foot; symptomatic onychomycosis  Plan: -All treatment options discussed with the patient including all alternatives, risks, complications.  -Lesion was anesthetized with lidocaine cream.  Skin was cleaned with alcohol then the lesion was then debrided utilizing the 312 scalpel to any complications or bleeding.  Monitor for any signs or symptoms of infection.  Offloading pads dispensed. -Nails debrided M78 without complications or bleeding -Patient encouraged to call the office with any questions, concerns, change in symptoms.   Trula Slade DPM

## 2020-10-22 ENCOUNTER — Ambulatory Visit (INDEPENDENT_AMBULATORY_CARE_PROVIDER_SITE_OTHER): Payer: Medicare Other | Admitting: Podiatry

## 2020-10-22 ENCOUNTER — Other Ambulatory Visit: Payer: Self-pay

## 2020-10-22 ENCOUNTER — Encounter: Payer: Self-pay | Admitting: Podiatry

## 2020-10-22 DIAGNOSIS — M79675 Pain in left toe(s): Secondary | ICD-10-CM | POA: Diagnosis not present

## 2020-10-22 DIAGNOSIS — Q828 Other specified congenital malformations of skin: Secondary | ICD-10-CM | POA: Diagnosis not present

## 2020-10-22 DIAGNOSIS — B351 Tinea unguium: Secondary | ICD-10-CM

## 2020-10-22 DIAGNOSIS — M79674 Pain in right toe(s): Secondary | ICD-10-CM

## 2020-10-23 NOTE — Progress Notes (Signed)
Subjective: 87 year old female presents the office today for recurrent skin lesion which is painful in the right foot submetatarsal 1 is also thick, elongated toes that she cannot trim himself.  Denies any open lesions. There is anything else that can be done for the painful lesion on the right foot.  Denies any systemic complaints such as fevers, chills, nausea, vomiting. No acute changes since last appointment, and no other complaints at this time.   Objective: AAO x3, NAD DP/PT pulses palpable bilaterally, CRT less than 3 seconds Thick hyperkeratotic lesion submetatarsal 1 on the right side.  No underlying ulceration drainage or signs of infection.  Prominence of the sesamoids. Nails are hypertrophic, dystrophic, brittle, discolored, elongated 10. No surrounding redness or drainage. Tenderness nails 1-5 bilaterally.  No pain with calf compression, swelling, warmth, erythema  Assessment: Porokeratosis, benign skin lesion right foot; symptomatic onychomycosis  Plan: -All treatment options discussed with the patient including all alternatives, risks, complications.  -Lesion was anesthetized with lidocaine cream.  Skin was cleaned with alcohol then the lesion was then debrided utilizing the 312 scalpel to any complications or bleeding.  Monitor for any signs or symptoms of infection.  We discussed treatment options including custom accommodative orthotics versus further offloading pads.  After discussion elects to proceed with.  Debridement of the callus with offloading.  Recommend moisturizer daily as well. -Nails debrided [id number] without complications or bleeding -Patient encouraged to call the office with any questions, concerns, change in symptoms.   Trula Slade DPM

## 2020-12-24 ENCOUNTER — Other Ambulatory Visit: Payer: Self-pay

## 2020-12-24 ENCOUNTER — Encounter: Payer: Self-pay | Admitting: Podiatry

## 2020-12-24 ENCOUNTER — Ambulatory Visit: Payer: Medicare Other | Admitting: Podiatry

## 2020-12-24 DIAGNOSIS — B351 Tinea unguium: Secondary | ICD-10-CM | POA: Diagnosis not present

## 2020-12-24 DIAGNOSIS — M79675 Pain in left toe(s): Secondary | ICD-10-CM

## 2020-12-24 DIAGNOSIS — M79674 Pain in right toe(s): Secondary | ICD-10-CM

## 2020-12-24 DIAGNOSIS — Q828 Other specified congenital malformations of skin: Secondary | ICD-10-CM | POA: Diagnosis not present

## 2020-12-24 NOTE — Progress Notes (Signed)
Subjective: 87 year old female presents the office today for recurrent skin lesion which is painful in the right foot submetatarsal 1 is also thick, elongated toes that she cannot trim himself.  Denies any open lesions. Denies any systemic complaints such as fevers, chills, nausea, vomiting. No acute changes since last appointment, and no other complaints at this time.   Objective: AAO x3, NAD DP/PT pulses palpable bilaterally, CRT less than 3 seconds Thick hyperkeratotic lesion submetatarsal 1 on the right side.  No underlying ulceration drainage or signs of infection.  Prominence of the sesamoids. Nails are hypertrophic, dystrophic, brittle, discolored, elongated 10. No surrounding redness or drainage. Tenderness nails 1-5 bilaterally.  No pain with calf compression, swelling, warmth, erythema  Assessment: Porokeratosis, benign skin lesion right foot; symptomatic onychomycosis  Plan: -All treatment options discussed with the patient including all alternatives, risks, complications.  -Lesion was anesthetized with lidocaine cream.  Skin was cleaned with alcohol then the lesion was then debrided utilizing the 312 scalpel to any complications or bleeding.  Monitor for any signs or symptoms of infection.  We discussed treatment options including custom accommodative orthotics versus further offloading pads.  Recommend moisturizer daily as well. -Nails debrided M21 without complications or bleeding -Patient encouraged to call the office with any questions, concerns, change in symptoms.   Lorenda Peck, DPM

## 2021-01-01 ENCOUNTER — Other Ambulatory Visit: Payer: Self-pay | Admitting: Cardiovascular Disease

## 2021-01-30 ENCOUNTER — Other Ambulatory Visit: Payer: Self-pay | Admitting: Cardiovascular Disease

## 2021-03-31 ENCOUNTER — Other Ambulatory Visit: Payer: Self-pay

## 2021-03-31 ENCOUNTER — Encounter: Payer: Self-pay | Admitting: Podiatry

## 2021-03-31 ENCOUNTER — Other Ambulatory Visit: Payer: Self-pay | Admitting: Cardiovascular Disease

## 2021-03-31 ENCOUNTER — Ambulatory Visit (INDEPENDENT_AMBULATORY_CARE_PROVIDER_SITE_OTHER): Payer: Medicare Other | Admitting: Podiatry

## 2021-03-31 DIAGNOSIS — B351 Tinea unguium: Secondary | ICD-10-CM | POA: Diagnosis not present

## 2021-03-31 DIAGNOSIS — M79674 Pain in right toe(s): Secondary | ICD-10-CM | POA: Diagnosis not present

## 2021-03-31 DIAGNOSIS — M79675 Pain in left toe(s): Secondary | ICD-10-CM

## 2021-03-31 DIAGNOSIS — Q828 Other specified congenital malformations of skin: Secondary | ICD-10-CM | POA: Diagnosis not present

## 2021-03-31 NOTE — Progress Notes (Signed)
Subjective: 88 year old female presents the office today for recurrent skin lesion which is painful in the right foot submetatarsal 1 is also thick, elongated toes that she cannot trim himself.  Denies any open lesions. Denies any systemic complaints such as fevers, chills, nausea, vomiting. No acute changes since last appointment, and no other complaints at this time.   Objective: AAO x3, NAD DP/PT pulses palpable bilaterally, CRT less than 3 seconds Thick hyperkeratotic lesion submetatarsal 1 on the right side.  No underlying ulceration drainage or signs of infection.  Prominence of the sesamoids. Nails are hypertrophic, dystrophic, brittle, discolored, elongated 10. No surrounding redness or drainage. Tenderness nails 1-5 bilaterally.  No pain with calf compression, swelling, warmth, erythema  Assessment: Porokeratosis, benign skin lesion right foot; symptomatic onychomycosis  Plan: -All treatment options discussed with the patient including all alternatives, risks, complications.  -Lesion was anesthetized with lidocaine cream.  Skin was cleaned with alcohol then the lesion was then debrided utilizing the 312 scalpel to any complications or bleeding.  Monitor for any signs or symptoms of infection.  We discussed treatment options including custom accommodative orthotics versus further offloading pads.  Recommend moisturizer daily as well. -Nails debrided W09 without complications or bleeding -Patient encouraged to call the office with any questions, concerns, change in symptoms.   Lorenda Peck, DPM

## 2021-04-19 ENCOUNTER — Encounter: Payer: Self-pay | Admitting: Cardiovascular Disease

## 2021-04-19 ENCOUNTER — Ambulatory Visit: Payer: Medicare Other | Admitting: Cardiovascular Disease

## 2021-04-19 ENCOUNTER — Other Ambulatory Visit: Payer: Self-pay

## 2021-04-19 VITALS — BP 118/60 | HR 57 | Ht 63.0 in | Wt 101.8 lb

## 2021-04-19 DIAGNOSIS — I493 Ventricular premature depolarization: Secondary | ICD-10-CM | POA: Diagnosis not present

## 2021-04-19 DIAGNOSIS — E7800 Pure hypercholesterolemia, unspecified: Secondary | ICD-10-CM | POA: Diagnosis not present

## 2021-04-19 DIAGNOSIS — I10 Essential (primary) hypertension: Secondary | ICD-10-CM

## 2021-04-19 DIAGNOSIS — I25119 Atherosclerotic heart disease of native coronary artery with unspecified angina pectoris: Secondary | ICD-10-CM | POA: Diagnosis not present

## 2021-04-19 NOTE — Progress Notes (Signed)
Chief Complaint  Patient presents with   Follow-up    CAD   History of Present Illness: 88 yo female with history of CAD, HTN, and HLD here today for cardiac follow up. She had an anterolateral MI in 2001 with stent placement in the Diagonal branch of the LAD. I met her in 2014. Stress myoview 12/11/12 with large scar in the anterior wall. Cardiac cath 12/18/12 with mild to moderate disease in the diagonal, Circumflex and RCA. She was seen in the ED January 2017 with dyspnea and palpitations. Troponin negative and EKG without ischemic changes. Echo 04/19/15 with normal LV systolic function, mild diastolic dysfunction. Nuclear stress test 04/19/15 with scar but no ischemia. Cardiac monitor 2017 with sinus, PACs, PVCs.   She is here today for follow up. The patient denies any chest pain, dyspnea, palpitations, lower extremity edema, orthopnea, PND, dizziness, near syncope or syncope.    Primary Care Physician: Wenda Low, MD  Past Medical History:  Diagnosis Date   Coronary artery disease    had diffuse three-vessel coronary disease without focal stenosis at that time -- Last cath was in 2007showed anterior wall hypokinesis with well-preserved apical and inferior wall motion with and EF of approximately 45% -- last cardiolite study 12/09 this showed an EF of 47% with anterior akinesis, anterior scar , & no ischemia general   Fatigue    History of acute anterior wall myocardial infarction 04/25/1999   with a stent placement in her first diagonal vessel, and subsequent cutting balloon to the diagonal in 2001   History of shingles    Hypercholesterolemia    Hypertension    Weakness     Past Surgical History:  Procedure Laterality Date   ABDOMINAL HYSTERECTOMY     BREAST BIOPSY     Bilateral   BREAST EXCISIONAL BIOPSY Bilateral    CHOLECYSTECTOMY     LEFT HEART CATHETERIZATION WITH CORONARY ANGIOGRAM N/A 12/18/2012   Procedure: LEFT HEART CATHETERIZATION WITH CORONARY ANGIOGRAM;   Surgeon: Burnell Blanks, MD;  Location: [facility] CATH LAB;  Service: Cardiovascular;  Laterality: N/A;    Current Outpatient Medications  Medication Sig Dispense Refill   aspirin 81 MG tablet Take 81 mg by mouth daily. As needed for pain     Calcium Carb-Cholecalciferol (CALCIUM CARBONATE-VITAMIN D3) 600-400 MG-UNIT TABS 1 tablet     escitalopram (LEXAPRO) 5 MG tablet Take 5 mg by mouth daily.     isosorbide mononitrate (IMDUR) 30 MG 24 hr tablet TAKE 1/2 OF A TABLET (15 MG TOTAL) BY MOUTH DAILY 45 tablet 0   losartan-hydrochlorothiazide (HYZAAR) 100-12.5 MG tablet TAKE 1 TABLET BY MOUTH EVERY DAY 90 tablet 1   metoprolol succinate (TOPROL-XL) 50 MG 24 hr tablet Take 1 tablet (50 mg total) by mouth daily. Please keep upcoming appt. In Dec. In order to receive future refills. 90 tablet 3   nitroGLYCERIN (NITROSTAT) 0.4 MG SL tablet PLACE 1 TABLET (0.4 MG TOTAL) UNDER THE TONGUE EVERY 5 (FIVE) MINUTES AS NEEDED FOR CHEST PAIN. 25 tablet 5   potassium chloride SA (KLOR-CON M20) 20 MEQ tablet Take 1 tablet (20 mEq total) by mouth daily. 90 tablet 3   simvastatin (ZOCOR) 40 MG tablet TAKE 1/2 TABLET BY MOUTH EVERY DAY AT BEDTIME 45 tablet 2   calcium carbonate (OS-CAL) 600 MG TABS Take 600 mg by mouth daily.   (Patient not taking: Reported on 04/19/2021)     No current facility-administered medications for this visit.    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  Tobacco Use   Smoking status: Never   Smokeless tobacco: Never  Substance and Sexual Activity   Alcohol use: No     Alcohol/week: 0.0 standard drinks   Drug use: No   Sexual activity: Not on file  Other Topics Concern   Not on file  Social History Narrative   Not on file   Social Determinants of Health   Financial Resource Strain: Not on file  Food Insecurity: Not on file  Transportation Needs: Not on file  Physical Activity: Not on file  Stress: Not on file  Social Connections: Not on file  Intimate Partner Violence: Not on file    Family History  Problem Relation Age of Onset   Liver disease Mother    Heart disease Father    Dementia Sister    Heart disease Brother    Healthy Sister     Review of Systems:  As stated in the HPI and otherwise negative.   BP 118/60    Pulse (!) 57    Ht $R'5\' 3"'xo$  (1.6 m)    Wt 101 lb 12.8 oz (46.2 kg)    SpO2 99%    BMI 18.03 kg/m   Physical Examination:  General: Well developed, well nourished, NAD  HEENT: OP clear, mucus membranes moist  SKIN: warm, dry. No rashes. Neuro: No focal deficits  Musculoskeletal: Muscle strength 5/5 all ext  Psychiatric: Mood and affect normal  Neck: No JVD, no carotid bruits, no thyromegaly, no lymphadenopathy.  Lungs:Clear bilaterally, no wheezes, rhonci, crackles Cardiovascular: Regular rate and rhythm. No murmurs, gallops or rubs. Abdomen:Soft. Bowel sounds present. Non-tender.  Extremities: No lower extremity edema. Pulses are 2 + in the bilateral DP/PT.  Cardiac cath 12/18/12: Left main: No obstructive disease.  Left Anterior Descending Artery: Moderate caliber vessel that courses to the apex. The vessel becomes very small in caliber at the apex. There are no obstructive lesions in the LAD. The diagonal branch is moderate in caliber with a patent stent. There is diffuse 40% restenosis within the stent. This does not appear to be flow limiting. Just beyond the stent there is a 40% stenosis.  Circumflex Artery: Large caliber vessel that continues as a large obtuse marginal branch. The proximal Circumflex has a focal  40% stenosis. The ostium of the first OM branch has 30% stenosis. The continuation of the AV groove Circumflex beyond the takeoff of the OM branch is small in caliber.  Right Coronary Artery: Large dominant vessel with diffuse 40% mid stenosis. There is mild disease in the posterolateral branch and PDA.  Left Ventricular Angiogram: LVEF=40%, hypokinesis of the anterior wall, apex.  Impression:  1. Moderate non-obstructive CAD  2. Moderate segmental LV systolic dysfunction  Recommendations: Continue medical management of CAD.   Echo 04/19/15: Left ventricle: The cavity size was normal. There was mild focal   basal hypertrophy of the septum. Systolic function was normal.   The estimated ejection fraction was in the range of 50% to 55%.   Doppler  parameters are consistent with abnormal left ventricular   relaxation (grade 1 diastolic dysfunction). The E/e&' ratio is   between 8-15, suggesting indeterminate LV filling pressure. - Mitral valve: Mildly thickened leaflets . There was trivial   regurgitation. - Tricuspid valve: There was mild regurgitation. - Pulmonary arteries: PA peak pressure: 32 mm Hg (S) + RAP. - Systemic veins: IVC not visualized. Impressions:  - Technically difficult study. LVEF 50-55%, no clear regional wall   motion abnormality, mild proximal septal thickening, diastolic   dysfunction with indeterminate LV filling pressure, normal atrial   size, mild TR, RVSP 32 mmHg + RAP  EKG:  EKG is  ordered today. The EKG demonstrates  sinus bradycardia, PVCs  Recent Labs: No results found for requested labs within last 8760 hours.   Lipid Panel Followed in primary care   Wt Readings from Last 3 Encounters:  04/19/21 101 lb 12.8 oz (46.2 kg)  02/05/20 102 lb (46.3 kg)  01/09/19 109 lb (49.4 kg)     Other studies Reviewed: Additional studies/ records that were reviewed today include: . Review of the above records demonstrates:    Assessment and Plan:   1. CAD with  stable angina: She has no chest pain. Last cath 12/18/12 with stable mild to moderate CAD. Nuclear stress test in 2017 with no ischemia. LV function is normal by echo in 2017. Continue ASA, statin, Imdur and beta blocker.     2. HTN: BP is well controlled. No changes  3. Hyperlipidemia: LDL 49 in Sept 2022. This is followed in primary care. Will continue statin  4. Premature ventricular contractions: No recent awareness of palpitations. Continue beta blocker.   Current medicines are reviewed at length with the patient today.  The patient does not have concerns regarding medicines.  The following changes have been made:  no change  Labs/ tests ordered today include:   Orders Placed This Encounter  Procedures   EKG 12-Lead    Disposition:   F/U with me in 12 months  Signed, Lauree Chandler, MD 04/19/2021 2:02 PM    [facility] [address], [place], [place]  [address] Phone: [phone]; Fax: [phone]

## 2021-04-19 NOTE — Patient Instructions (Signed)
Medication Instructions:  Your physician recommends that you continue on your current medications as directed. Please refer to the Current Medication list given to you today.  *If you need a refill on your cardiac medications before your next appointment, please call your pharmacy*   Lab Work: None ordered   If you have labs (blood work) drawn today and your tests are completely normal, you will receive your results only by: [place] (if you have MyChart) OR A paper copy in the mail If you have any lab test that is abnormal or we need to change your treatment, we will call you to review the results.   Testing/Procedures: None ordered    Follow-Up: At [facility], you and your health needs are our priority.  As part of our continuing mission to provide you with exceptional heart care, we have created designated Provider Care Teams.  These Care Teams include your primary Cardiologist (physician) and Advanced Practice Providers (APPs -  Physician Assistants and Nurse Practitioners) who all work together to provide you with the care you need, when you need it.  We recommend signing up for the patient portal called "MyChart".  Sign up information is provided on this After Visit Summary.  MyChart is used to connect with patients for Virtual Visits (Telemedicine).  Patients are able to view lab/test results, encounter notes, upcoming appointments, etc.  Non-urgent messages can be sent to your provider as well.   To learn more about what you can do with MyChart, go to [email or web address].    Your next appointment:   12 month(s)  The format for your next appointment:   In Person  Provider:   Lauree Chandler, MD     Other Instructions None

## 2021-06-02 ENCOUNTER — Other Ambulatory Visit: Payer: Self-pay

## 2021-06-02 ENCOUNTER — Ambulatory Visit: Payer: Medicare Other | Admitting: Podiatry

## 2021-06-02 ENCOUNTER — Encounter: Payer: Self-pay | Admitting: Podiatry

## 2021-06-02 DIAGNOSIS — M79675 Pain in left toe(s): Secondary | ICD-10-CM | POA: Diagnosis not present

## 2021-06-02 DIAGNOSIS — M79674 Pain in right toe(s): Secondary | ICD-10-CM | POA: Diagnosis not present

## 2021-06-02 DIAGNOSIS — B351 Tinea unguium: Secondary | ICD-10-CM | POA: Diagnosis not present

## 2021-06-02 DIAGNOSIS — Q828 Other specified congenital malformations of skin: Secondary | ICD-10-CM | POA: Diagnosis not present

## 2021-06-02 NOTE — Progress Notes (Signed)
Subjective: ?88 year old female presents the office today for recurrent skin lesion which is painful in the right foot submetatarsal 1 is also thick, elongated toes that she cannot trim himself.  Denies any open lesions. ?Denies any systemic complaints such as fevers, chills, nausea, vomiting. No acute changes since last appointment, and no other complaints at this time.  ? ?Objective: ?AAO x3, NAD ?DP/PT pulses palpable bilaterally, CRT less than 3 seconds ?Thick hyperkeratotic lesion submetatarsal 1 on the right side.  No underlying ulceration drainage or signs of infection.  Prominence of the sesamoids. ?Nails are hypertrophic, dystrophic, brittle, discolored, elongated ?10. No surrounding redness or drainage. Tenderness nails 1-5 bilaterally.  ?No pain with calf compression, swelling, warmth, erythema ? ?Assessment: ?Porokeratosis, benign skin lesion right foot; symptomatic onychomycosis ? ?Plan: ?-All treatment options discussed with the patient including all alternatives, risks, complications.  ?-Lesion was anesthetized with lidocaine cream.  Skin was cleaned with alcohol then the lesion was then debrided utilizing the 312 scalpel to any complications or bleeding.  Monitor for any signs or symptoms of infection.  We discussed treatment options including custom accommodative orthotics versus further offloading pads.  Recommend moisturizer daily as well. ?-Nails debrided X83 without complications or bleeding ?-Patient encouraged to call the office with any questions, concerns, change in symptoms.  ? ?Lorenda Peck, DPM  ? ? ?

## 2021-06-10 ENCOUNTER — Other Ambulatory Visit: Payer: Self-pay | Admitting: Cardiovascular Disease

## 2021-06-13 ENCOUNTER — Other Ambulatory Visit: Payer: Self-pay

## 2021-06-13 MED ORDER — NITROGLYCERIN 0.4 MG SL SUBL: 0.4000 mg | SUBLINGUAL_TABLET | SUBLINGUAL | 8 refills | Status: AC | PRN

## 2021-06-15 ENCOUNTER — Other Ambulatory Visit: Payer: Self-pay

## 2021-06-15 MED ORDER — ISOSORBIDE MONONITRATE ER 30 MG PO TB24: ORAL_TABLET | ORAL | 3 refills | Status: AC

## 2021-07-04 ENCOUNTER — Other Ambulatory Visit: Payer: Self-pay

## 2021-07-04 MED ORDER — SIMVASTATIN 40 MG PO TABS: ORAL_TABLET | ORAL | 2 refills | Status: DC

## 2021-07-06 ENCOUNTER — Telehealth: Payer: Self-pay | Admitting: Cardiovascular Disease

## 2021-07-06 NOTE — Telephone Encounter (Signed)
Follow Up: ? ? ? ? ?Patient is returning Silvana call. ?

## 2021-07-06 NOTE — Telephone Encounter (Signed)
Spoke with patient's daughter Rollene Fare, on [place]. ? ?Rollene Fare states she has started helping with patient's medications and patient was not sure how much simvastatin she was supposed to be taking. ? ?Confirmed patient is prescribed Simvastatin '20mg'$  (half of '40mg'$  tablet) QD. ? ?Rollene Fare verbalized understanding and thanked me for calling. ?

## 2021-07-06 NOTE — Telephone Encounter (Signed)
Daughter of the patient called. She wanted to go over the dosages of the patient's medications, specifically the simvastatin (ZOCOR) 40 MG tablet ?

## 2021-07-06 NOTE — Telephone Encounter (Signed)
Left  message for Rollene Fare to call back. ?

## 2021-07-12 ENCOUNTER — Telehealth: Payer: Self-pay

## 2021-07-12 MED ORDER — SIMVASTATIN 20 MG PO TABS: 20.0000 mg | ORAL_TABLET | Freq: Every day | ORAL | 3 refills | Status: AC

## 2021-07-12 NOTE — Telephone Encounter (Signed)
[facility] is requesting a refill on simvastatin 20 mg tablet instead of simvastatin 40 mg tablet, having to cut the tablet in half. Would Dr. Angelena Form like to prescribe simvastatin 20 mg tablets? Please address ?

## 2021-08-02 ENCOUNTER — Ambulatory Visit: Payer: Medicare Other | Admitting: Podiatry

## 2021-08-02 DIAGNOSIS — M79674 Pain in right toe(s): Secondary | ICD-10-CM

## 2021-08-02 DIAGNOSIS — M79675 Pain in left toe(s): Secondary | ICD-10-CM | POA: Diagnosis not present

## 2021-08-02 DIAGNOSIS — Q828 Other specified congenital malformations of skin: Secondary | ICD-10-CM | POA: Diagnosis not present

## 2021-08-02 DIAGNOSIS — B351 Tinea unguium: Secondary | ICD-10-CM

## 2021-08-04 NOTE — Progress Notes (Signed)
Subjective: 88 year old female presents the office today for recurrent skin lesion which is painful in the right foot submetatarsal 1 is also thick, elongated toes that she cannot trim himself.  Denies any open lesions. Denies any systemic complaints such as fevers, chills, nausea, vomiting. No acute changes since last appointment, and no other complaints at this time.   Objective: AAO x3, NAD- presents with daughter  DP/PT pulses palpable bilaterally, CRT less than 3 seconds Thick hyperkeratotic lesion submetatarsal 1 on the right side.  No underlying ulceration drainage or signs of infection.  Prominence of the sesamoids. Nails are hypertrophic, dystrophic, brittle, discolored, elongated 10. No surrounding redness or drainage. Tenderness nails 1-5 bilaterally.  No pain with calf compression, swelling, warmth, erythema  Assessment: Porokeratosis, benign skin lesion right foot; symptomatic onychomycosis  Plan: -All treatment options discussed with the patient including all alternatives, risks, complications.  -Sharply debrided the thick hyperkeratotic lesion of the right submetatarsal 1 without any complications or bleeding. -Nails debrided K09 without complications or bleeding to patient comfort. -Patient encouraged to call the office with any questions, concerns, change in symptoms.  *Applied lidocaine cream on the right callus prior to debridement  Trula Slade DPM

## 2021-08-29 ENCOUNTER — Other Ambulatory Visit: Payer: Self-pay

## 2021-08-29 MED ORDER — LOSARTAN POTASSIUM-HCTZ 100-12.5 MG PO TABS: 1.0000 | ORAL_TABLET | Freq: Every day | ORAL | 1 refills | Status: AC

## 2021-10-06 ENCOUNTER — Ambulatory Visit: Payer: Medicare Other | Admitting: Podiatry

## 2021-10-06 DIAGNOSIS — M79675 Pain in left toe(s): Secondary | ICD-10-CM | POA: Diagnosis not present

## 2021-10-06 DIAGNOSIS — B351 Tinea unguium: Secondary | ICD-10-CM | POA: Diagnosis not present

## 2021-10-06 DIAGNOSIS — Q828 Other specified congenital malformations of skin: Secondary | ICD-10-CM | POA: Diagnosis not present

## 2021-10-06 DIAGNOSIS — M79674 Pain in right toe(s): Secondary | ICD-10-CM

## 2021-10-06 NOTE — Progress Notes (Signed)
Subjective: 88 year old female presents the office today for recurrent skin lesion which is painful in the right foot submetatarsal 1 is also thick, elongated toes that she cannot trim himself.  Denies any open lesions.  No new concerns otherwise.  Objective: AAO x3, NAD- presents with daughter  DP/PT pulses palpable bilaterally, CRT less than 3 seconds Thick hyperkeratotic lesion submetatarsal 1 on the right side.  No underlying ulceration drainage or signs of infection.  Prominence of the sesamoids. Nails are hypertrophic, dystrophic, brittle, discolored, elongated 10. No surrounding redness or drainage. Tenderness nails 1-5 bilaterally.  No pain with calf compression, swelling, warmth, erythema  Assessment: Porokeratosis, benign skin lesion right foot; symptomatic onychomycosis  Plan: -All treatment options discussed with the patient including all alternatives, risks, complications.  -Sharply debrided the thick hyperkeratotic lesion of the right submetatarsal 1 without any complications or bleeding. -Nails debrided Z61 without complications or bleeding to patient comfort. -Patient encouraged to call the office with any questions, concerns, change in symptoms.  *Apply lidocaine cream on the right callus prior to debridement  Trula Slade DPM

## 2022-01-09 ENCOUNTER — Ambulatory Visit: Payer: Medicare Other | Admitting: Podiatry

## 2022-01-09 DIAGNOSIS — M79675 Pain in left toe(s): Secondary | ICD-10-CM

## 2022-01-09 DIAGNOSIS — B351 Tinea unguium: Secondary | ICD-10-CM

## 2022-01-09 DIAGNOSIS — M79674 Pain in right toe(s): Secondary | ICD-10-CM

## 2022-01-09 DIAGNOSIS — L84 Corns and callosities: Secondary | ICD-10-CM | POA: Diagnosis not present

## 2022-01-11 NOTE — Progress Notes (Signed)
Subjective: Chief Complaint  Patient presents with   Callouses    Nail trim and callus     88 year old female presents the office today for recurrent skin lesion which is painful in the right foot submetatarsal 1 is also thick, elongated toes that she cannot trim himself.  Denies any open lesions.  No new concerns otherwise.  Objective: AAO x3, NAD- presents with daughter  DP/PT pulses palpable bilaterally, CRT less than 3 seconds Thick hyperkeratotic lesion submetatarsal 1 on the right side.  No underlying ulceration drainage or signs of infection.  Prominence of the sesamoids. Nails are hypertrophic, dystrophic, brittle, discolored, elongated 10. No surrounding redness or drainage. Tenderness nails 1-5 bilaterally.  No pain with calf compression, swelling, warmth, erythema  Assessment: Porokeratosis, benign skin lesion right foot; symptomatic onychomycosis  Plan: -All treatment options discussed with the patient including all alternatives, risks, complications.  -Sharply debrided the thick hyperkeratotic lesion of the right submetatarsal 1 without any complications or bleeding.  Continue offloading.  Moisturizer daily. -Nails debrided D63 without complications or bleeding to patient comfort. -Patient encouraged to call the office with any questions, concerns, change in symptoms.  *Apply lidocaine cream on the right callus prior to debridement  Trula Slade DPM

## 2022-03-05 ENCOUNTER — Other Ambulatory Visit: Payer: Self-pay

## 2022-03-05 ENCOUNTER — Encounter (HOSPITAL_BASED_OUTPATIENT_CLINIC_OR_DEPARTMENT_OTHER): Payer: Self-pay | Admitting: Emergency Medicine

## 2022-03-05 ENCOUNTER — Emergency Department (HOSPITAL_BASED_OUTPATIENT_CLINIC_OR_DEPARTMENT_OTHER)
Admission: EM | Admit: 2022-03-05 | Discharge: 2022-03-05 | Disposition: A | Discharge status: Home / Self Care | Payer: Medicare Other | Attending: Emergency Medicine | Admitting: Emergency Medicine

## 2022-03-05 ENCOUNTER — Emergency Department (HOSPITAL_BASED_OUTPATIENT_CLINIC_OR_DEPARTMENT_OTHER): Payer: Medicare Other

## 2022-03-05 DIAGNOSIS — M79601 Pain in right arm: Secondary | ICD-10-CM | POA: Insufficient documentation

## 2022-03-05 DIAGNOSIS — M25511 Pain in right shoulder: Secondary | ICD-10-CM | POA: Diagnosis not present

## 2022-03-05 DIAGNOSIS — Z7982 Long term (current) use of aspirin: Secondary | ICD-10-CM | POA: Insufficient documentation

## 2022-03-05 DIAGNOSIS — R531 Weakness: Secondary | ICD-10-CM | POA: Diagnosis not present

## 2022-03-05 MED ORDER — LIDOCAINE 5 % EX PTCH
1.0000 | MEDICATED_PATCH | CUTANEOUS | Status: DC
  Administered 2022-03-05: 1 via TRANSDERMAL

## 2022-03-05 MED ORDER — ACETAMINOPHEN 500 MG PO TABS
1000.0000 mg | ORAL_TABLET | ORAL | Status: AC
  Administered 2022-03-05: 1000 mg via ORAL
  Filled 2022-03-05: qty 2

## 2022-03-05 NOTE — ED Provider Notes (Signed)
[facility] EMERGENCY DEPARTMENT Provider Note   CSN: [id number] Arrival date & time: 03/05/22  1824     History {Add pertinent medical, surgical, social history, OB history to HPI:1} Chief Complaint  Patient presents with   Arm Pain    [name] is a 88 y.o. female.  3 wks hsoulder injection Yesterday pain in shoudler worse with weakness of arm that improves with the day and looseining her arm up, not dropping no numb, pain now radiates from shoulder to elbow, no trauma  Slurred spch occasionally over past few weeks? Resolves - no other stroke sxs  Has been taking voltaren gel       Home Medications Prior to Admission medications   Medication Sig Start Date End Date Taking? Authorizing Provider  aspirin 81 MG tablet Take 81 mg by mouth daily. As needed for pain    [provider]  Calcium Carb-Cholecalciferol (CALCIUM CARBONATE-VITAMIN D3) 600-400 MG-UNIT TABS 1 tablet    [provider]  calcium carbonate (OS-CAL) 600 MG TABS Take 600 mg by mouth daily.   Patient not taking: Reported on 04/19/2021    [provider]  escitalopram (LEXAPRO) 5 MG tablet Take 5 mg by mouth daily. 02/02/20   [provider]  isosorbide mononitrate (IMDUR) 30 MG 24 hr tablet TAKE 1/2 OF A TABLET (15 MG TOTAL) BY MOUTH DAILY 06/15/21   Burnell Blanks, MD  KLOR-CON M20 20 MEQ tablet TAKE 1 TABLET BY MOUTH EVERY DAY 06/10/21   Burnell Blanks, MD  losartan-hydrochlorothiazide (HYZAAR) 100-12.5 MG tablet Take 1 tablet by mouth daily. 08/29/21   Burnell Blanks, MD  metoprolol succinate (TOPROL-XL) 50 MG 24 hr tablet Take 1 tablet (50 mg total) by mouth daily. Please keep upcoming appt. In Dec. In order to receive future refills. 01/03/21   Burnell Blanks, MD  nitroGLYCERIN (NITROSTAT) 0.4 MG SL tablet Place 1 tablet (0.4 mg total) under the tongue every 5 (five) minutes as needed for chest pain. 06/13/21    Burnell Blanks, MD  simvastatin (ZOCOR) 20 MG tablet Take 1 tablet (20 mg total) by mouth at bedtime. 07/12/21   Burnell Blanks, MD      Allergies    Actonel [risedronate sodium] and Altace [ramipril]    Review of Systems   Review of Systems  Physical Exam Updated Vital Signs BP (!) 116/55 (BP Location: Left Arm)   Pulse 67   Temp 98.5 F (36.9 C) (Oral)   Resp 15   Ht '5\' 3"'$  (1.6 m)   Wt 49.9 kg   SpO2 98%   BMI 19.49 kg/m  Physical Exam  ED Results / Procedures / Treatments   Labs (all labs ordered are listed, but only abnormal results are displayed) Labs Reviewed - No data to display  EKG None  Radiology No results found.  Procedures Procedures  {Document cardiac monitor, telemetry assessment procedure when appropriate:1}  Medications Ordered in ED Medications - No data to display  ED Course/ Medical Decision Making/ A&P                           Medical Decision Making  ***  {Document critical care time when appropriate:1} {Document review of labs and clinical decision tools ie heart score, Chads2Vasc2 etc:1}  {Document your independent review of radiology images, and any outside records:1} {Document your discussion with family members, caretakers, and with consultants:1} {Document social determinants of health affecting  pt's care:1} {Document your decision making why or why not admission, treatments were needed:1} Final Clinical Impression(s) / ED Diagnoses Final diagnoses:  None    Rx / DC Orders ED Discharge Orders     None

## 2022-03-05 NOTE — Discharge Instructions (Addendum)
You were seen in the emergency department for your shoulder and elbow pain.  You had a reassuring evaluation and x-rays.  Please follow-up with your orthopedic doctor soon as possible regarding your symptoms.  Please use Tylenol and lidocaine patches for your arm.  You may also use over-the-counter gels such as Voltaren gel.  Return immediately to the emergency department if you experience any of the following: Worsening pain, weakness, numbness, or any other concerning symptoms.

## 2022-03-05 NOTE — ED Notes (Signed)
Patient transported to X-ray 

## 2022-03-05 NOTE — ED Notes (Signed)
Pt ambulated independently to bathroom.

## 2022-03-05 NOTE — ED Triage Notes (Signed)
Pt c/o continued RUE pain; had injection from orthopedics 3 wks ago; arm has not improved and she is having trouble using it at times

## 2022-03-21 ENCOUNTER — Encounter: Payer: Self-pay | Admitting: Podiatry

## 2022-03-21 ENCOUNTER — Ambulatory Visit: Payer: Medicare Other | Admitting: Podiatry

## 2022-03-21 VITALS — BP 145/59 | HR 69

## 2022-03-21 DIAGNOSIS — L84 Corns and callosities: Secondary | ICD-10-CM | POA: Diagnosis not present

## 2022-03-21 DIAGNOSIS — M79675 Pain in left toe(s): Secondary | ICD-10-CM | POA: Diagnosis not present

## 2022-03-21 DIAGNOSIS — B351 Tinea unguium: Secondary | ICD-10-CM

## 2022-03-21 DIAGNOSIS — M79674 Pain in right toe(s): Secondary | ICD-10-CM

## 2022-03-22 NOTE — Progress Notes (Signed)
Subjective: Chief Complaint  Patient presents with   Debridement    Trim toenails and calluses     89 year old female presents the office today for recurrent skin lesion which is painful in the right foot submetatarsal 1 is also thick, elongated toes that she cannot trim himself.  No open lesions that she reports and she has no other concerns today.    Objective: AAO x3, NAD- presents with daughter  DP/PT pulses palpable bilaterally, CRT less than 3 seconds Thick hyperkeratotic lesion submetatarsal 1 on the right side.  No underlying ulceration drainage or signs of infection.  Prominence of the sesamoids. Nails are hypertrophic, dystrophic, brittle, discolored, elongated 10. No surrounding redness or drainage. Tenderness nails 1-5 bilaterally.  No pain with calf compression, swelling, warmth, erythema  Assessment: Porokeratosis, benign skin lesion right foot; symptomatic onychomycosis  Plan: -All treatment options discussed with the patient including all alternatives, risks, complications.  -Lidocaine cream was applied to the callus lesion.  Sharply debrided the thick hyperkeratotic lesion of the right submetatarsal 1 without any complications or bleeding.  Continue offloading.  Moisturizer daily. -Nails debrided C14 without complications or bleeding to patient comfort. -Patient encouraged to call the office with any questions, concerns, change in symptoms.  *Apply lidocaine cream on the right callus prior to debridement  Trula Slade DPM

## 2022-05-18 ENCOUNTER — Emergency Department (HOSPITAL_COMMUNITY)
Admission: EM | Admit: 2022-05-18 | Discharge: 2022-05-18 | Disposition: A | Discharge status: Home / Self Care | Payer: Medicare Other | Attending: Emergency Medicine | Admitting: Emergency Medicine

## 2022-05-18 ENCOUNTER — Other Ambulatory Visit: Payer: Self-pay

## 2022-05-18 ENCOUNTER — Emergency Department (HOSPITAL_COMMUNITY): Payer: Medicare Other

## 2022-05-18 DIAGNOSIS — D72829 Elevated white blood cell count, unspecified: Secondary | ICD-10-CM | POA: Insufficient documentation

## 2022-05-18 DIAGNOSIS — Z1152 Encounter for screening for COVID-19: Secondary | ICD-10-CM | POA: Insufficient documentation

## 2022-05-18 DIAGNOSIS — I10 Essential (primary) hypertension: Secondary | ICD-10-CM | POA: Diagnosis not present

## 2022-05-18 DIAGNOSIS — N3000 Acute cystitis without hematuria: Secondary | ICD-10-CM | POA: Diagnosis not present

## 2022-05-18 DIAGNOSIS — R441 Visual hallucinations: Secondary | ICD-10-CM | POA: Insufficient documentation

## 2022-05-18 DIAGNOSIS — R440 Auditory hallucinations: Secondary | ICD-10-CM | POA: Insufficient documentation

## 2022-05-18 DIAGNOSIS — I2510 Atherosclerotic heart disease of native coronary artery without angina pectoris: Secondary | ICD-10-CM | POA: Insufficient documentation

## 2022-05-18 DIAGNOSIS — R443 Hallucinations, unspecified: Secondary | ICD-10-CM

## 2022-05-18 LAB — CBC WITH DIFFERENTIAL/PLATELET
Abs Immature Granulocytes: 0.04 10*3/uL (ref 0.00–0.07)
Basophils Absolute: 0 10*3/uL (ref 0.0–0.1)
Basophils Relative: 0 %
Eosinophils Absolute: 0 10*3/uL (ref 0.0–0.5)
Eosinophils Relative: 0 %
HCT: 33.8 % — ABNORMAL LOW (ref 36.0–46.0)
Hemoglobin: 10.8 g/dL — ABNORMAL LOW (ref 12.0–15.0)
Immature Granulocytes: 0 %
Lymphocytes Relative: 10 %
Lymphs Abs: 1.3 10*3/uL (ref 0.7–4.0)
MCH: 31.2 pg (ref 26.0–34.0)
MCHC: 32 g/dL (ref 30.0–36.0)
MCV: 97.7 fL (ref 80.0–100.0)
Monocytes Absolute: 1.5 10*3/uL — ABNORMAL HIGH (ref 0.1–1.0)
Monocytes Relative: 11 %
Neutro Abs: 10.6 10*3/uL — ABNORMAL HIGH (ref 1.7–7.7)
Neutrophils Relative %: 79 %
Platelets: 287 10*3/uL (ref 150–400)
RBC: 3.46 MIL/uL — ABNORMAL LOW (ref 3.87–5.11)
RDW: 14.9 % (ref 11.5–15.5)
WBC: 13.4 10*3/uL — ABNORMAL HIGH (ref 4.0–10.5)
nRBC: 0 % (ref 0.0–0.2)

## 2022-05-18 LAB — RAPID URINE DRUG SCREEN, HOSP PERFORMED
Amphetamines: NOT DETECTED
Barbiturates: NOT DETECTED
Benzodiazepines: NOT DETECTED
Cocaine: NOT DETECTED
Opiates: NOT DETECTED
Tetrahydrocannabinol: NOT DETECTED

## 2022-05-18 LAB — RESP PANEL BY RT-PCR (RSV, FLU A&B, COVID)  RVPGX2
Influenza A by PCR: NEGATIVE
Influenza B by PCR: NEGATIVE
Resp Syncytial Virus by PCR: NEGATIVE
SARS Coronavirus 2 by RT PCR: NEGATIVE

## 2022-05-18 LAB — ETHANOL: Alcohol, Ethyl (B): <10 mg/dL (ref <10)

## 2022-05-18 LAB — COMPREHENSIVE METABOLIC PANEL
ALT: 14 U/L (ref 0–44)
AST: 21 U/L (ref 15–41)
Albumin: 3.5 g/dL (ref 3.5–5.0)
Alkaline Phosphatase: 53 U/L (ref 38–126)
Anion gap: 8 (ref 5–15)
BUN: 32 mg/dL — ABNORMAL HIGH (ref 8–23)
CO2: 23 mmol/L (ref 22–32)
Calcium: 9.2 mg/dL (ref 8.9–10.3)
Chloride: 107 mmol/L (ref 98–111)
Creatinine, Ser: 1.01 mg/dL — ABNORMAL HIGH (ref 0.44–1.00)
GFR, Estimated: 54 mL/min — ABNORMAL LOW (ref >60)
Glucose, Bld: 129 mg/dL — ABNORMAL HIGH (ref 70–99)
Potassium: 4.1 mmol/L (ref 3.5–5.1)
Sodium: 138 mmol/L (ref 135–145)
Total Bilirubin: 0.4 mg/dL (ref 0.3–1.2)
Total Protein: 6.5 g/dL (ref 6.5–8.1)

## 2022-05-18 LAB — URINALYSIS, ROUTINE W REFLEX MICROSCOPIC
Bilirubin Urine: NEGATIVE
Glucose, UA: NEGATIVE mg/dL
Ketones, ur: NEGATIVE mg/dL
Nitrite: POSITIVE — AB
Protein, ur: NEGATIVE mg/dL
Specific Gravity, Urine: 1.014 (ref 1.005–1.030)
pH: 5 (ref 5.0–8.0)

## 2022-05-18 LAB — TSH: TSH: 4.697 u[IU]/mL — ABNORMAL HIGH (ref 0.350–4.500)

## 2022-05-18 LAB — T4, FREE: Free T4: 0.87 ng/dL (ref 0.61–1.12)

## 2022-05-18 MED ORDER — CEPHALEXIN 500 MG PO CAPS: 500.0000 mg | ORAL_CAPSULE | Freq: Two times a day (BID) | ORAL | 0 refills | Status: AC

## 2022-05-18 MED ORDER — SODIUM CHLORIDE 0.9 % IV SOLN
1.0000 g | Freq: Once | INTRAVENOUS | Status: AC
  Administered 2022-05-18: 1 g via INTRAVENOUS
  Filled 2022-05-18: qty 10

## 2022-05-18 MED ORDER — SODIUM CHLORIDE 0.9 % IV BOLUS
500.0000 mL | Freq: Once | INTRAVENOUS | Status: AC
  Administered 2022-05-18: 500 mL via INTRAVENOUS

## 2022-05-18 NOTE — ED Notes (Signed)
Patient has no concerns after AVS has been reviewed and patient education provided. Patient discharged.Patient has no concerns after AVS has been reviewed and patient education provided. Patient discharged with daughters.

## 2022-05-18 NOTE — ED Provider Notes (Signed)
Emergency Department Provider Note   I have reviewed the triage vital signs and the nursing notes.   HISTORY  Chief Complaint Hallucinations   HPI [name] is a 89 y.o. female past history of CAD, hypertension, hyperlipidemia presents to the emergency department with her daughter for evaluation of acute onset hallucinations and paranoia.  She has no history of dementia.  Daughter notes some occasional memory lapses which they attributed to age.  In January, they moved into assisted living.  She was with her husband initially but he required elevation to memory care.  The patient has been alone in a single apartment since late January but did not develop the presenting symptoms until 2 days ago.  The daughter reports they requested a UA be performed but unsure if this was ever done.  No new medications other than stool softeners and some vitamins.  She does take low-dose Lexapro nightly for anxiety which is not new either. Patient has no complaints. Daughter is me that she has been calling saying that people are trying to kidnap her or that she is hearing bulldozer's running outside.  He was apparently hearing people whispering to her at times.  Patient tells me today she went down to visit her husband and developed a plan where he would walk out of the unit and she would get the car to drive him away without staff knowing.    Past Medical History:  Diagnosis Date   Coronary artery disease    had diffuse three-vessel coronary disease without focal stenosis at that time -- Last cath was in 2007showed anterior wall hypokinesis with well-preserved apical and inferior wall motion with and EF of approximately 45% -- last cardiolite study 12/09 this showed an EF of 47% with anterior akinesis, anterior scar , & no ischemia general   Fatigue    History of acute anterior wall myocardial infarction 04/25/1999   with a stent placement in her first diagonal vessel, and subsequent cutting  balloon to the diagonal in 2001   History of shingles    Hypercholesterolemia    Hypertension    Weakness     Review of Systems  Constitutional: No fever/chills. Positive AMS.  Eyes: No visual changes. ENT: No sore throat. Cardiovascular: Denies chest pain. Respiratory: Denies shortness of breath. Gastrointestinal: No abdominal pain.  No nausea, no vomiting.  No diarrhea.  No constipation. Genitourinary: Negative for dysuria. Musculoskeletal: Negative for back pain. Skin: Negative for rash. Neurological: Negative for headaches, focal weakness or numbness.  ____________________________________________   PHYSICAL EXAM:  VITAL SIGNS: ED Triage Vitals [05/18/22 1834]  Enc Vitals Group     BP (!) 146/58     Pulse Rate 68     Resp 18     Temp 97.9 F (36.6 C)     Temp Source Oral     SpO2 99 %   Constitutional: Alert and clam/cooperative. Well appearing and in no acute distress. Eyes: Conjunctivae are normal. PERRL.  Head: Atraumatic. Nose: No congestion/rhinnorhea. Mouth/Throat: Mucous membranes are moist.   Neck: No stridor.  Cardiovascular: Normal rate, regular rhythm. Good peripheral circulation. Grossly normal heart sounds.   Respiratory: Normal respiratory effort.  No retractions. Lungs CTAB. Gastrointestinal: Soft and nontender. No distention.  Musculoskeletal: No lower extremity tenderness nor edema. No gross deformities of extremities. Neurologic:  Normal speech and language. No gross focal neurologic deficits are appreciated. 5/5 strength in the bilateral upper/lower extremities.  Skin:  Skin is warm, dry and intact. No rash  noted.   ____________________________________________   LABS (all labs ordered are listed, but only abnormal results are displayed)  Labs Reviewed  COMPREHENSIVE METABOLIC PANEL - Abnormal; Notable for the following components:      Result Value   Glucose, Bld 129 (*)    BUN 32 (*)    Creatinine, Ser 1.01 (*)    GFR, Estimated 54  (*)    All other components within normal limits  CBC WITH DIFFERENTIAL/PLATELET - Abnormal; Notable for the following components:   WBC 13.4 (*)    RBC 3.46 (*)    Hemoglobin 10.8 (*)    HCT 33.8 (*)    Neutro Abs 10.6 (*)    Monocytes Absolute 1.5 (*)    All other components within normal limits  TSH - Abnormal; Notable for the following components:   TSH 4.697 (*)    All other components within normal limits  URINALYSIS, ROUTINE W REFLEX MICROSCOPIC - Abnormal; Notable for the following components:   Hgb urine dipstick SMALL (*)    Nitrite POSITIVE (*)    Leukocytes,Ua SMALL (*)    Bacteria, UA MANY (*)    All other components within normal limits  RESP PANEL BY RT-PCR (RSV, FLU A&B, COVID)  RVPGX2  RAPID URINE DRUG SCREEN, HOSP PERFORMED  ETHANOL  T4, FREE   ____________________________________________  EKG   EKG Interpretation  Date/Time:  Thursday May 18 2022 19:35:09 EST Ventricular Rate:  68 PR Interval:    QRS Duration: 107 QT Interval:  415 QTC Calculation: 442 R Axis:   -44 Text Interpretation: Normal sinus rhythm Left ventricular hypertrophy Anterior infarct, old  ST changes simialr to 2017 tracing Confirmed by Nanda Quinton [id number]) on 05/18/2022 10:53:29 PM        ____________________________________________  RADIOLOGY  CT Head Wo Contrast  Result Date: 05/18/2022 CLINICAL DATA:  Sudden onset hallucinations 2 days ago EXAM: CT HEAD WITHOUT CONTRAST TECHNIQUE: Contiguous axial images were obtained from the base of the skull through the vertex without intravenous contrast. RADIATION DOSE REDUCTION: This exam was performed according to the departmental dose-optimization program which includes automated exposure control, adjustment of the mA and/or kV according to patient size and/or use of iterative reconstruction technique. COMPARISON:  None Available. FINDINGS: Brain: No intracranial hemorrhage, mass effect, or evidence of acute infarct. No  hydrocephalus. No extra-axial fluid collection. Generalized cerebral atrophy. Ill-defined hypoattenuation within the cerebral white matter is nonspecific but consistent with chronic small vessel ischemic disease. Vascular: No hyperdense vessel. Intracranial arterial calcification. Skull: No fracture or focal lesion. Sinuses/Orbits: Moderate volume extraconal gas between the eyelid and the anterior superior orbit is of doubtful significance. No mass effect on the globe. No adjacent inflammatory change. Postoperative changes both lenses. The globes are intact. No inflammatory stranding. Paranasal sinuses and mastoid air cells are well aerated. Other: None. IMPRESSION: 1. No acute intracranial abnormality. 2. Age commensurate cerebral atrophy and chronic small vessel ischemic disease. Electronically Signed   By: Placido Sou M.D.   On: 05/18/2022 19:35    ____________________________________________   PROCEDURES  Procedure(s) performed:   Procedures  None  ____________________________________________   INITIAL IMPRESSION / ASSESSMENT AND PLAN / ED COURSE  Pertinent labs & imaging results that were available during my care of the patient were reviewed by me and considered in my medical decision making (see chart for details).   This patient is Presenting for Evaluation of AMS, which does require a range of treatment options, and is a complaint that involves a high  risk of morbidity and mortality.  The Differential Diagnoses includes but is not exclusive to alcohol, illicit or prescription medications, intracranial pathology such as stroke, intracerebral hemorrhage, fever or infectious causes including sepsis, hypoxemia, uremia, trauma, endocrine related disorders such as diabetes, hypoglycemia, thyroid-related diseases, etc.   Critical Interventions-    Medications  cefTRIAXone (ROCEPHIN) 1 g in sodium chloride 0.9 % 100 mL IVPB (1 g Intravenous New Bag/Given 05/18/22 2238)  sodium chloride  0.9 % bolus 500 mL (0 mLs Intravenous Stopped 05/18/22 2134)    Reassessment after intervention:  Symptoms improved.    I did obtain Additional Historical Information from daughter at bedside.   Clinical Laboratory Tests Ordered, included CBC with leukocytosis to 13.  No acute kidney injury.  UA positive for infection.  COVID/RSV negative.  Radiologic Tests Ordered, included CT head. I independently interpreted the images and agree with radiology interpretation.   Cardiac Monitor Tracing which shows NSR.   Social Determinants of Health Risk no smoking history. No EtOH.   Medical Decision Making: Summary:  Patient presents emergency department with acute onset altered mental status over the past 2 days has been having new onset paranoia and hallucinations which mainly sound auditory but some visual reported as well.  Plan for UA along with screening blood work and CT imaging of the head.  No focal findings on neuroexam.  Reevaluation with update and discussion with patient and daughter at bedside.  We discussed symptoms and results including urinary tract infection.  Plan for Rocephin IV here and will start on Keflex.  She is currently at an assisted living facility with support.  Awake/alert here.  Actually correcting the pharmacy location for me during my discussion with family and sending Rx. Plan for outpatient mgmt.   Considered admission but calm and not particularly altered at this time. Plan for abx and PCP follow up.   Patient's presentation is most consistent with acute presentation with potential threat to life or bodily function.   Disposition: discharge  ____________________________________________  FINAL CLINICAL IMPRESSION(S) / ED DIAGNOSES  Final diagnoses:  Hallucinations  Acute cystitis without hematuria     NEW OUTPATIENT MEDICATIONS STARTED DURING THIS VISIT:  New Prescriptions   CEPHALEXIN (KEFLEX) 500 MG CAPSULE    Take 1 capsule (500 mg total) by mouth 2  (two) times daily for 7 days.    Note:  This document was prepared using Dragon voice recognition software and may include unintentional dictation errors.  Nanda Quinton, MD, [facility] Emergency Medicine    Aasha Dina, Wonda Olds, MD 05/18/22 2255

## 2022-05-18 NOTE — ED Triage Notes (Signed)
Pt from [facility] with sudden onset of hallucinations 2 days ago.  Some frightening.  Daughter states these are all new symptoms.  Pt family requested UA 2 days ago at facility but are unsure if it was ever done.

## 2022-05-18 NOTE — Discharge Instructions (Signed)
You were seen in the emergency room today with hallucinations.  I suspect that your symptoms are related to a urinary tract infection which we are treating with antibiotics.  Please follow close with your primary care physician and return with any new or suddenly worsening symptoms.

## 2022-05-23 ENCOUNTER — Emergency Department (HOSPITAL_COMMUNITY): Payer: Medicare Other

## 2022-05-23 ENCOUNTER — Other Ambulatory Visit: Payer: Self-pay

## 2022-05-23 ENCOUNTER — Emergency Department (HOSPITAL_COMMUNITY)
Admission: EM | Admit: 2022-05-23 | Discharge: 2022-05-24 | Disposition: A | Discharge status: Home / Self Care | Payer: Medicare Other | Attending: Emergency Medicine | Admitting: Emergency Medicine

## 2022-05-23 ENCOUNTER — Encounter (HOSPITAL_COMMUNITY): Payer: Self-pay

## 2022-05-23 DIAGNOSIS — D72829 Elevated white blood cell count, unspecified: Secondary | ICD-10-CM | POA: Diagnosis not present

## 2022-05-23 DIAGNOSIS — M6281 Muscle weakness (generalized): Secondary | ICD-10-CM | POA: Diagnosis not present

## 2022-05-23 DIAGNOSIS — R109 Unspecified abdominal pain: Secondary | ICD-10-CM | POA: Insufficient documentation

## 2022-05-23 DIAGNOSIS — R2681 Unsteadiness on feet: Secondary | ICD-10-CM | POA: Insufficient documentation

## 2022-05-23 DIAGNOSIS — R410 Disorientation, unspecified: Secondary | ICD-10-CM | POA: Diagnosis not present

## 2022-05-23 DIAGNOSIS — R7989 Other specified abnormal findings of blood chemistry: Secondary | ICD-10-CM | POA: Insufficient documentation

## 2022-05-23 DIAGNOSIS — I2510 Atherosclerotic heart disease of native coronary artery without angina pectoris: Secondary | ICD-10-CM | POA: Diagnosis not present

## 2022-05-23 DIAGNOSIS — M545 Low back pain, unspecified: Secondary | ICD-10-CM | POA: Insufficient documentation

## 2022-05-23 DIAGNOSIS — Z7982 Long term (current) use of aspirin: Secondary | ICD-10-CM | POA: Insufficient documentation

## 2022-05-23 DIAGNOSIS — Z79899 Other long term (current) drug therapy: Secondary | ICD-10-CM | POA: Diagnosis not present

## 2022-05-23 DIAGNOSIS — I700 Atherosclerosis of aorta: Secondary | ICD-10-CM | POA: Insufficient documentation

## 2022-05-23 DIAGNOSIS — I10 Essential (primary) hypertension: Secondary | ICD-10-CM | POA: Diagnosis not present

## 2022-05-23 DIAGNOSIS — R443 Hallucinations, unspecified: Secondary | ICD-10-CM | POA: Diagnosis present

## 2022-05-23 DIAGNOSIS — Z87440 Personal history of urinary (tract) infections: Secondary | ICD-10-CM | POA: Diagnosis not present

## 2022-05-23 HISTORY — DX: Vitamin D deficiency, unspecified: E55.9

## 2022-05-23 HISTORY — DX: Anemia, unspecified: D64.9

## 2022-05-23 LAB — CBC WITH DIFFERENTIAL/PLATELET
Abs Immature Granulocytes: 0.03 10*3/uL (ref 0.00–0.07)
Basophils Absolute: 0 10*3/uL (ref 0.0–0.1)
Basophils Relative: 0 %
Eosinophils Absolute: 0.1 10*3/uL (ref 0.0–0.5)
Eosinophils Relative: 1 %
HCT: 40.4 % (ref 36.0–46.0)
Hemoglobin: 12.7 g/dL (ref 12.0–15.0)
Immature Granulocytes: 0 %
Lymphocytes Relative: 13 %
Lymphs Abs: 1.5 10*3/uL (ref 0.7–4.0)
MCH: 30.5 pg (ref 26.0–34.0)
MCHC: 31.4 g/dL (ref 30.0–36.0)
MCV: 97.1 fL (ref 80.0–100.0)
Monocytes Absolute: 1.8 10*3/uL — ABNORMAL HIGH (ref 0.1–1.0)
Monocytes Relative: 15 %
Neutro Abs: 8.7 10*3/uL — ABNORMAL HIGH (ref 1.7–7.7)
Neutrophils Relative %: 71 %
Platelets: 283 10*3/uL (ref 150–400)
RBC: 4.16 MIL/uL (ref 3.87–5.11)
RDW: 14.6 % (ref 11.5–15.5)
WBC: 12.2 10*3/uL — ABNORMAL HIGH (ref 4.0–10.5)
nRBC: 0 % (ref 0.0–0.2)

## 2022-05-23 LAB — URINALYSIS, ROUTINE W REFLEX MICROSCOPIC
Bacteria, UA: NONE SEEN
Bilirubin Urine: NEGATIVE
Glucose, UA: NEGATIVE mg/dL
Ketones, ur: NEGATIVE mg/dL
Leukocytes,Ua: NEGATIVE
Nitrite: NEGATIVE
Protein, ur: NEGATIVE mg/dL
Specific Gravity, Urine: 1.014 (ref 1.005–1.030)
pH: 7 (ref 5.0–8.0)

## 2022-05-23 LAB — COMPREHENSIVE METABOLIC PANEL
ALT: 20 U/L (ref 0–44)
AST: 32 U/L (ref 15–41)
Albumin: 3.8 g/dL (ref 3.5–5.0)
Alkaline Phosphatase: 68 U/L (ref 38–126)
Anion gap: 8 (ref 5–15)
BUN: 24 mg/dL — ABNORMAL HIGH (ref 8–23)
CO2: 26 mmol/L (ref 22–32)
Calcium: 9.4 mg/dL (ref 8.9–10.3)
Chloride: 105 mmol/L (ref 98–111)
Creatinine, Ser: 1.08 mg/dL — ABNORMAL HIGH (ref 0.44–1.00)
GFR, Estimated: 50 mL/min — ABNORMAL LOW (ref >60)
Glucose, Bld: 99 mg/dL (ref 70–99)
Potassium: 3.7 mmol/L (ref 3.5–5.1)
Sodium: 139 mmol/L (ref 135–145)
Total Bilirubin: 0.5 mg/dL (ref 0.3–1.2)
Total Protein: 6.8 g/dL (ref 6.5–8.1)

## 2022-05-23 LAB — LACTIC ACID, PLASMA
Lactic Acid, Venous: 1.4 mmol/L (ref 0.5–1.9)
Lactic Acid, Venous: 1.2 mmol/L (ref 0.5–1.9)

## 2022-05-23 MED ORDER — CEPHALEXIN 500 MG PO CAPS
500.0000 mg | ORAL_CAPSULE | Freq: Once | ORAL | Status: AC
  Administered 2022-05-24: 500 mg via ORAL
  Filled 2022-05-23: qty 1

## 2022-05-23 MED ORDER — IOHEXOL 300 MG/ML  SOLN
75.0000 mL | Freq: Once | INTRAMUSCULAR | Status: AC | PRN
  Administered 2022-05-23: 75 mL via INTRAVENOUS

## 2022-05-23 MED ORDER — SODIUM CHLORIDE (PF) 0.9 % IJ SOLN
INTRAMUSCULAR | Status: AC
  Filled 2022-05-23: qty 50

## 2022-05-23 NOTE — ED Provider Notes (Signed)
I received this patient handoff from previous pain.  Please see their notes for original history and workup thus far.  In short patient is a 89 year old female who presents today with overall decline and worsening confusion.  Recently diagnosed with a UTI a week ago and was placed on Keflex.  This was appropriate as confirmed by urine culture.  At this time patient's workup is largely benign.  Family is uncomfortable with her going home to her assisted living community.  Plan is for me to reach out to PT/OT.  Patient also may require social work consultation so I will reach out to them to.   Additionally, CT abdomen pelvis pending at this time.  I will follow-up on these results.  My attending MD and I also decided to order MRI brain for further eval/for neurology to be able to utilize outpatient.  Physical Exam  BP 138/75   Pulse 71   Temp 98.7 F (37.1 C) (Oral)   Resp 18   Ht '5\' 3"'$  (1.6 m)   Wt 44.5 kg   SpO2 96%   BMI 17.36 kg/m   Physical Exam Vitals and nursing note reviewed.  Constitutional:      Appearance: Normal appearance.  HENT:     Head: Normocephalic and atraumatic.  Eyes:     General: No scleral icterus.    Conjunctiva/sclera: Conjunctivae normal.  Pulmonary:     Effort: Pulmonary effort is normal. No respiratory distress.  Skin:    Findings: No rash.  Neurological:     Mental Status: She is alert.  Psychiatric:        Mood and Affect: Mood normal.     Procedures  Procedures  ED Course / MDM   Clinical Course as of 05/23/22 2124  Tue May 23, 2022  1940 MRI says the patient is a few down on the list.  They report probably between 1 to 2 hours [MR]    Clinical Course User Index [MR] Aleena Kirkeby, Cecilio Asper, PA-C   Medical Decision Making Amount and/or Complexity of Data Reviewed Labs: ordered. Radiology: ordered.  Risk Prescription drug management.  CTAP negative for acute findings of abdomen and L-spine.  Unfortunately there has been a very long delay  on MRI and this is still pending at this time.  Patient signed out to oncoming PA, Soijett Blue.  Please see her note for the results of this imaging and ultimate disposition.    Unfortunately the patient also is awaiting PT/OT consult that likely will not happen until tomorrow morning.  She will continue to board in the emergency department until she can be appropriately dispod.  Social work is already involved and has written a note.  Patient may require SNF placement down the line however this can be reevaluated after PT/OT weighs in. Will need to be continually monitored for worsening delirium.     Darliss Ridgel 05/23/22 2134    Isla Pence, MD 05/25/22 [phone]

## 2022-05-23 NOTE — Care Management (Addendum)
Transition of Care [facility]) - Emergency Department Mini Assessment   Patient Details  Name: [name] MRN: [id number] Date of Birth: 09-06-1932  [name]) CM/SW Contact:    Roseanne Kaufman, RN Phone Number: 05/23/2022, 5:55 PM   Clinical Narrative: Received TOC consult for HH/DME. Per chart review patient from [facility]. Patient recently seen in  [place] on 05/18/22 for UTI. This RNCM attempt to call patient's daughter will follow up.   TOC following for needs - 6:45pm This RNCM spoke with patient and daughter [name] at bedside. Daughter [name] request to speak with her sisters who are in the lobby. This RNCM spoke with Candee Furbish and Rollene Fare to discuss process for short term rehab SNF placement. Patient's family has a preference for [facility] as patient's best friend is being admitted there as well. This RNCM advise will note SNF preference. Awaiting PT evaluation.  TOC will continue to follow.     ED Mini Assessment: What brought you to the Emergency Department? : lower back pain and confusion  Barriers to Discharge: Continued Medical Work up        Interventions which prevented an admission or readmission: [place] or Services    Patient Contact and Communications        ,                 Admission diagnosis:  UTI Back Pain Patient Active Problem List   Diagnosis Date Noted   Age-related osteoporosis without current pathological fracture 07/23/2020   Anxiety 07/23/2020   Chronic kidney disease, stage 3 unspecified ([place]) 07/23/2020   Hypothyroid 07/23/2020   Old myocardial infarction 07/23/2020   Osteopenia 07/23/2020   Pure hypercholesterolemia 07/23/2020   Pain of left hip joint 11/13/2019   Menopausal syndrome 12/06/2015   Dyspnea on exertion 04/06/2015   Fluttering sensation of heart 04/06/2015   Chest tightness 04/06/2015   CAD (coronary artery disease) 09/09/2010   HTN (hypertension) 09/09/2010    Hyperlipidemia 09/09/2010   PCP:  System, Provider Not In Pharmacy:   [id number] [id number]: [phone]: [phone]

## 2022-05-23 NOTE — ED Triage Notes (Signed)
Pt is a resident on [facility]. Pt presents with recent diagnosis of UTI (2/29) and was placed on Keflex. Pt has had gradual worsening and now has lower back pain and confusion.

## 2022-05-23 NOTE — ED Provider Notes (Signed)
[place] EMERGENCY DEPARTMENT AT [facility] Provider Note   CSN: [id number] Arrival date & time: 05/23/22  1055     History  No chief complaint on file.   [name] is a 89 y.o. female.  Patient with past history of CAD, hypertension, hyperlipidemia --presents to the emergency department for ongoing hallucinations and back pain.  Patient was seen in the emergency department for similar on 05/18/2022.  She had an evaluation including labs, urine testing, and head CT at that time.  She was found to have a UTI.  Urine culture was not sent.  She was given a dose of Rocephin and was started on Keflex.  Patient's family member bedside noted a couple of days of "intermittent improvement" however symptoms have once again worsened.  She is extremely paranoid thinking that someone is after her.  She was stating someone was trying to cut her today.  This is prompted reevaluation in the emergency department.  No documented fevers.  No headache.  She has started to complain of pain across her lower back.  She continues to have urinary frequency.  Patient was primary giver for her husband with dementia however both were recently moved to assisted living and family member has noted general decompensation since that time.       Home Medications Prior to Admission medications   Medication Sig Start Date End Date Taking? Authorizing Provider  aspirin 81 MG tablet Take 81 mg by mouth daily. As needed for pain    [provider]  Calcium Carb-Cholecalciferol (CALCIUM CARBONATE-VITAMIN D3) 600-400 MG-UNIT TABS 1 tablet    [provider]  calcium carbonate (OS-CAL) 600 MG TABS Take 600 mg by mouth daily.   Patient not taking: Reported on 04/19/2021    [provider]  cephALEXin (KEFLEX) 500 MG capsule Take 1 capsule (500 mg total) by mouth 2 (two) times daily for 7 days. 05/18/22 05/25/22  Long, Wonda Olds, MD  escitalopram (LEXAPRO) 5 MG tablet Take 5 mg by  mouth daily. 02/02/20   [provider]  isosorbide mononitrate (IMDUR) 30 MG 24 hr tablet TAKE 1/2 OF A TABLET (15 MG TOTAL) BY MOUTH DAILY 06/15/21   Burnell Blanks, MD  KLOR-CON M20 20 MEQ tablet TAKE 1 TABLET BY MOUTH EVERY DAY 06/10/21   Burnell Blanks, MD  losartan-hydrochlorothiazide (HYZAAR) 100-12.5 MG tablet Take 1 tablet by mouth daily. 08/29/21   Burnell Blanks, MD  metoprolol succinate (TOPROL-XL) 50 MG 24 hr tablet Take 1 tablet (50 mg total) by mouth daily. Please keep upcoming appt. In Dec. In order to receive future refills. 01/03/21   Burnell Blanks, MD  nitroGLYCERIN (NITROSTAT) 0.4 MG SL tablet Place 1 tablet (0.4 mg total) under the tongue every 5 (five) minutes as needed for chest pain. 06/13/21   Burnell Blanks, MD  simvastatin (ZOCOR) 20 MG tablet Take 1 tablet (20 mg total) by mouth at bedtime. 07/12/21   Burnell Blanks, MD      Allergies    Actonel [risedronate sodium] and Altace [ramipril]    Review of Systems   Review of Systems  Physical Exam Updated Vital Signs BP 136/68 (BP Location: Left Arm)   Pulse 77   Temp 99.1 F (37.3 C) (Oral)   Resp 16   Ht '5\' 3"'$  (1.6 m)   Wt 44.5 kg   SpO2 98%   BMI 17.36 kg/m  Physical Exam Vitals and nursing note reviewed.  Constitutional:  General: She is not in acute distress.    Appearance: She is well-developed.  HENT:     Head: Normocephalic and atraumatic.     Right Ear: External ear normal.     Left Ear: External ear normal.     Nose: Nose normal.     Mouth/Throat:     Mouth: Mucous membranes are moist.  Eyes:     Conjunctiva/sclera: Conjunctivae normal.  Cardiovascular:     Rate and Rhythm: Normal rate and regular rhythm.     Heart sounds: No murmur heard. Pulmonary:     Effort: No respiratory distress.     Breath sounds: No wheezing, rhonchi or rales.  Abdominal:     Palpations: Abdomen is soft.     Tenderness: There is no abdominal  tenderness. There is no guarding or rebound.  Musculoskeletal:     Cervical back: Normal range of motion and neck supple.     Right lower leg: No edema.     Left lower leg: No edema.     Comments: No lumbar spine tenderness  Skin:    General: Skin is warm and dry.     Findings: No rash.  Neurological:     General: No focal deficit present.     Mental Status: She is alert. Mental status is at baseline.     Motor: No weakness.  Psychiatric:        Mood and Affect: Mood normal.     ED Results / Procedures / Treatments   Labs (all labs ordered are listed, but only abnormal results are displayed) Labs Reviewed  URINALYSIS, ROUTINE W REFLEX MICROSCOPIC - Abnormal; Notable for the following components:      Result Value   Hgb urine dipstick SMALL (*)    All other components within normal limits  CBC WITH DIFFERENTIAL/PLATELET - Abnormal; Notable for the following components:   WBC 12.2 (*)    Neutro Abs 8.7 (*)    Monocytes Absolute 1.8 (*)    All other components within normal limits  COMPREHENSIVE METABOLIC PANEL - Abnormal; Notable for the following components:   BUN 24 (*)    Creatinine, Ser 1.08 (*)    GFR, Estimated 50 (*)    All other components within normal limits  LACTIC ACID, PLASMA  LACTIC ACID, PLASMA  URINALYSIS, W/ REFLEX TO CULTURE (INFECTION SUSPECTED)    EKG None  Radiology No results found.  Procedures Procedures    Medications Ordered in ED Medications - No data to display  ED Course/ Medical Decision Making/ A&P    Patient seen and examined. History obtained directly from patient and family member.  Reviewed recent ED workup and notes.  Labs/EKG: Ordered CBC, CMP, UA, lactate.  Imaging: Awaiting renal function, plan CT abdomen pelvis.  Medications/Fluids: None ordered  Most recent vital signs reviewed and are as follows: BP 136/68 (BP Location: Left Arm)   Pulse 77   Temp 99.1 F (37.3 C) (Oral)   Resp 16   Ht '5\' 3"'$  (1.6 m)   Wt  44.5 kg   SpO2 98%   BMI 17.36 kg/m   Initial impression: Recent diagnosis of UTI, paranoia  2:35 PM Reassessment performed. Patient appears stable.  Updated patient and family on lab results.  Currently awaiting CT scan.  Labs personally reviewed and interpreted including: CBC with differential showed elevated white blood cell count and elevated neutrophils at 12.2 and 8.7 respectively, otherwise normal hemoglobin; CMP minimally elevated creatinine at 1.08 and BUN of 24  otherwise unremarkable; lactate 1.4.  UA today appears improved from previous with small dipstick hemoglobin, but 0-5 red and white cells on microscopy.  Imaging personally visualized and interpreted including: Awaiting CT  Reviewed pertinent lab work and imaging with patient at bedside. Questions answered.   Most current vital signs reviewed and are as follows: BP 133/75 (BP Location: Right Arm)   Pulse 74   Temp 98.7 F (37.1 C) (Oral)   Resp 18   Ht '5\' 3"'$  (1.6 m)   Wt 44.5 kg   SpO2 97%   BMI 17.36 kg/m   Plan: Follow-up CT  Dr. Philip Aspen to see. Family does voice concerns about her going back to her assisted living with current confusion.   3:14 PM Signout to [place] PA-C at shift change. Pending attending MD eval.   Plan: F/u CT results. If neg, consider SW/CM consult to assist in determining if other options for safe dispo exist at her current facility, geripsych consult due to new paranoid behaviors.                                Medical Decision Making Amount and/or Complexity of Data Reviewed Labs: ordered. Radiology: ordered.  Risk Prescription drug management.   Pt here with confusion and paranoia -- recent dx with UTI however appears better today after 5 days of Keflex. No fever or signs of meningitis/encephalitis. Awaiting CT for eval given recent reports of back pain.           Final Clinical Impression(s) / ED Diagnoses Final diagnoses:  Delirium  Confusion    Rx / DC  Orders ED Discharge Orders     None         Carlisle Cater, PA-C 05/23/22 1525    Fransico Meadow, MD 05/26/22 1150

## 2022-05-23 NOTE — ED Provider Notes (Signed)
Care transferred from [facility], PA-C at time of sign out. See their note for full assessment.   Briefly: Patient is 89 y.o. female with history of CAD, HTN, HLD who presents to the ED with concerns for back pain and decline.    Plan: Plan per previous PA-C: MRI and consult with neuro if concerning findings on scan, PT/OT in the AM for consideration of SNF placement.   Labs Reviewed  URINALYSIS, ROUTINE W REFLEX MICROSCOPIC - Abnormal; Notable for the following components:      Result Value   Hgb urine dipstick SMALL (*)    All other components within normal limits  CBC WITH DIFFERENTIAL/PLATELET - Abnormal; Notable for the following components:   WBC 12.2 (*)    Neutro Abs 8.7 (*)    Monocytes Absolute 1.8 (*)    All other components within normal limits  COMPREHENSIVE METABOLIC PANEL - Abnormal; Notable for the following components:   BUN 24 (*)    Creatinine, Ser 1.08 (*)    GFR, Estimated 50 (*)    All other components within normal limits  LACTIC ACID, PLASMA  LACTIC ACID, PLASMA  URINALYSIS, W/ REFLEX TO CULTURE (INFECTION SUSPECTED)   Results for orders placed or performed during the hospital encounter of 05/23/22  Urinalysis, Routine w reflex microscopic -Urine, Clean Catch  Result Value Ref Range   Color, Urine YELLOW YELLOW   APPearance CLEAR CLEAR   Specific Gravity, Urine 1.014 1.005 - 1.030   pH 7.0 5.0 - 8.0   Glucose, UA NEGATIVE NEGATIVE mg/dL   Hgb urine dipstick SMALL (A) NEGATIVE   Bilirubin Urine NEGATIVE NEGATIVE   Ketones, ur NEGATIVE NEGATIVE mg/dL   Protein, ur NEGATIVE NEGATIVE mg/dL   Nitrite NEGATIVE NEGATIVE   Leukocytes,Ua NEGATIVE NEGATIVE   RBC / HPF 0-5 0 - 5 RBC/hpf   WBC, UA 0-5 0 - 5 WBC/hpf   Bacteria, UA NONE SEEN NONE SEEN   Squamous Epithelial / HPF 0-5 0 - 5 /HPF  CBC with Differential  Result Value Ref Range   WBC 12.2 (H) 4.0 - 10.5 K/uL   RBC 4.16 3.87 - 5.11 MIL/uL   Hemoglobin 12.7 12.0 - 15.0 g/dL   HCT 40.4 36.0 -  46.0 %   MCV 97.1 80.0 - 100.0 fL   MCH 30.5 26.0 - 34.0 pg   MCHC 31.4 30.0 - 36.0 g/dL   RDW 14.6 11.5 - 15.5 %   Platelets 283 150 - 400 K/uL   nRBC 0.0 0.0 - 0.2 %   Neutrophils Relative % 71 %   Neutro Abs 8.7 (H) 1.7 - 7.7 K/uL   Lymphocytes Relative 13 %   Lymphs Abs 1.5 0.7 - 4.0 K/uL   Monocytes Relative 15 %   Monocytes Absolute 1.8 (H) 0.1 - 1.0 K/uL   Eosinophils Relative 1 %   Eosinophils Absolute 0.1 0.0 - 0.5 K/uL   Basophils Relative 0 %   Basophils Absolute 0.0 0.0 - 0.1 K/uL   Immature Granulocytes 0 %   Abs Immature Granulocytes 0.03 0.00 - 0.07 K/uL  Comprehensive metabolic panel  Result Value Ref Range   Sodium 139 135 - 145 mmol/L   Potassium 3.7 3.5 - 5.1 mmol/L   Chloride 105 98 - 111 mmol/L   CO2 26 22 - 32 mmol/L   Glucose, Bld 99 70 - 99 mg/dL   BUN 24 (H) 8 - 23 mg/dL   Creatinine, Ser 1.08 (H) 0.44 - 1.00 mg/dL   Calcium 9.4  8.9 - 10.3 mg/dL   Total Protein 6.8 6.5 - 8.1 g/dL   Albumin 3.8 3.5 - 5.0 g/dL   AST 32 15 - 41 U/L   ALT 20 0 - 44 U/L   Alkaline Phosphatase 68 38 - 126 U/L   Total Bilirubin 0.5 0.3 - 1.2 mg/dL   GFR, Estimated 50 (L) >60 mL/min   Anion gap 8 5 - 15  Lactic acid, plasma  Result Value Ref Range   Lactic Acid, Venous 1.4 0.5 - 1.9 mmol/L  Lactic acid, plasma  Result Value Ref Range   Lactic Acid, Venous 1.2 0.5 - 1.9 mmol/L   MR BRAIN WO CONTRAST  Result Date: 05/23/2022 CLINICAL DATA:  Mental status change, unknown cause EXAM: MRI HEAD WITHOUT CONTRAST TECHNIQUE: Multiplanar, multiecho pulse sequences of the brain and surrounding structures were obtained without intravenous contrast. COMPARISON:  No prior MRI available, correlation is made with 05/18/2022 CT head FINDINGS: Brain: No restricted diffusion to suggest acute or subacute infarct. No acute hemorrhage, mass, mass effect, or midline shift. No hydrocephalus or extra-axial collection. Normal pituitary and craniocervical junction. No hemosiderin deposition to  suggest remote hemorrhage. Confluent T2 hyperintense signal in the periventricular white matter, likely the sequela of moderate to severe chronic small vessel ischemic disease. Cerebral volume is within normal limits for age. Vascular: Normal arterial flow voids. Skull and upper cervical spine: Normal marrow signal. Sinuses/Orbits: Mucous retention cyst in the left maxillary sinus. Otherwise clear paranasal sinuses. Status post bilateral lens replacements. Other: Trace fluid in left mastoid air cells. IMPRESSION: No acute intracranial process. No evidence of acute or subacute infarct. Electronically Signed   By: Merilyn Baba M.D.   On: 05/23/2022 22:20   Clinical Course as of 05/24/22 0502  Tue May 23, 2022  1940 MRI says the patient is a few down on the list.  They report probably between 1 to 2 hours [MR]  2346 Discussion with patient daughter regarding lab and imaging results. Discussed with patient daughter regarding plans for PT/OT evaluation in the AM. Discussed with daughter that we can provide information for on-call neurology for outpatient follow up as needed. Daughter agreeable. Answered all available questions.  [SB]    Clinical Course User Index [MR] Redwine, Madison A, PA-C [SB] Tranika Scholler A, PA-C     MRI brain without concerning findings at this time for acute intracranial process. Pt pending PT/OT evaluation for consideration of SNF placement.   Patient case discussed with Margarita Mail, PA-C at sign-out. Plan at sign-out is pending PT/OT evaluation for SNF consideration, and disposition per evaluation, however, plans may change as per oncoming team. Patient care transferred at sign out.    This chart was dictated using voice recognition software, Dragon. Despite the best efforts of this provider to proofread and correct errors, errors may still occur which can change documentation meaning.   Jatniel Verastegui A, PA-C 05/24/22 [place], [place], DO 05/24/22 1511

## 2022-05-24 MED ORDER — ACETAMINOPHEN 500 MG PO TABS
1000.0000 mg | ORAL_TABLET | Freq: Once | ORAL | Status: AC
  Administered 2022-05-24: 1000 mg via ORAL
  Filled 2022-05-24: qty 2

## 2022-05-24 NOTE — Evaluation (Signed)
Physical Therapy Evaluation Patient Details Name: Joyah Donham MRN: [id number] DOB: [birth year] Today's Date: 05/24/2022  History of Present Illness  patient is a 89 year old female who presents 05/23/22 with overall decline and worsening confusion. PMH: CAD, hypertension, hyperlipidemia. Ppresented to the ED 2/29 and DC back to ALFwith  acute onset hallucinations and paranoia. Recently in ED  diagnosed with a UTI.  Clinical Impression  Pt admitted with above diagnosis.  Pt currently with functional limitations due to the deficits listed below (see PT Problem List). Pt will benefit from skilled PT to increase their independence and safety with mobility to allow discharge to the venue listed below.     The  patient is alert and  participated in short distance ambulation with 2 person Hand hold assistance.  2 daughter's present and report that patient is "The best she has been ". Per family, patient   comes From ALF, was ambulatory without a device until recent AMS. Most recently has  been more WC dependent at the facility.  Continue PT for progressive ambulation/mobility.     Recommendations for follow up therapy are one component of a multi-disciplinary discharge planning process, led by the attending physician.  Recommendations may be updated based on patient status, additional functional criteria and insurance authorization.  Follow Up Recommendations Skilled nursing-short term rehab (<3 hours/day) Can patient physically be transported by private vehicle: Yes    Assistance Recommended at Discharge Frequent or constant Supervision/Assistance  Patient can return home with the following  A little help with walking and/or transfers;A little help with bathing/dressing/bathroom;Help with stairs or ramp for entrance;Assistance with cooking/housework;Assist for transportation    Equipment Recommendations None recommended by PT  Recommendations for Other Services       Functional Status  Assessment Patient has had a recent decline in their functional status and demonstrates the ability to make significant improvements in function in a reasonable and predictable amount of time.     Precautions / Restrictions Precautions Precautions: Fall      Mobility  Bed Mobility Overal bed mobility: Needs Assistance Bed Mobility: Rolling, Sidelying to Sit, Sit to Supine Rolling: Min assist Sidelying to sit: Mod assist   Sit to supine: Min assist   General bed mobility comments: assisted with legs and trunk    Transfers Overall transfer level: Needs assistance Equipment used: 1 person hand held assist Transfers: Sit to/from Stand Sit to Stand: Mod assist           General transfer comment: patient  held to rail in front of her to stand from stretcher x 3    Ambulation/Gait Ambulation/Gait assistance: Min assist, +2 physical assistance Gait Distance (Feet): 20 Feet Assistive device: 2 person hand held assist Gait Pattern/deviations: Step-to pattern, Step-through pattern, Drifts right/left Gait velocity: decr     General Gait Details: steady assistance  for balance when ambulating  Stairs            Wheelchair Mobility    Modified Rankin (Stroke Patients Only)       Balance Overall balance assessment: History of Falls, Needs assistance Sitting-balance support: No upper extremity supported, Feet supported Sitting balance-Leahy Scale: Fair     Standing balance support: Bilateral upper extremity supported, Reliant on assistive device for balance, During functional activity Standing balance-Leahy Scale: Poor Standing balance comment: reliant on support  Pertinent Vitals/Pain Pain Assessment Pain Assessment: Faces Faces Pain Scale: Hurts little more Pain Location: right shoulder Pain Descriptors / Indicators: Discomfort Pain Intervention(s): Monitored during session, Limited activity within patient's tolerance     Home Living Family/patient expects to be discharged to:: Assisted living                 Home Equipment: Conservation officer, nature (2 wheels)      Prior Function Prior Level of Function : Needs assist       Physical Assist : Mobility (physical) Mobility (physical): Bed mobility;Transfers;Gait   Mobility Comments: since 2/29 has been mostly in [facility] and assisted 24/7 by family at ALF. ADLs Comments: assisted     Hand Dominance   Dominant Hand: Right    Extremity/Trunk Assessment   Upper Extremity Assessment Upper Extremity Assessment: RUE deficits/detail RUE Deficits / Details: limited shoulder elevation    Lower Extremity Assessment Lower Extremity Assessment: Generalized weakness    Cervical / Trunk Assessment Cervical / Trunk Assessment: Kyphotic  Communication   Communication: No difficulties  Cognition Arousal/Alertness: Awake/alert Behavior During Therapy: WFL for tasks assessed/performed Overall Cognitive Status: Within Functional Limits for tasks assessed                                 General Comments: stated Tuesday, Oriented to  mo, yr, place        General Comments      Exercises     Assessment/Plan    PT Assessment Patient needs continued PT services  PT Problem List Decreased strength;Decreased range of motion;Decreased activity tolerance;Decreased balance;Decreased mobility;Decreased knowledge of precautions       PT Treatment Interventions DME instruction;Functional mobility training;Gait training;Therapeutic activities;Therapeutic exercise;Balance training;Patient/family education    PT Goals (Current goals can be found in the Care Plan section)  Acute Rehab PT Goals Patient Stated Goal: get back to her normal PT Goal Formulation: With patient/family Time For Goal Achievement: 06/07/22 Potential to Achieve Goals: Good    Frequency Min 2X/week     Co-evaluation               AM-PAC PT "6 Clicks" Mobility   Outcome Measure Help needed turning from your back to your side while in a flat bed without using bedrails?: A Lot Help needed moving from lying on your back to sitting on the side of a flat bed without using bedrails?: A Lot Help needed moving to and from a bed to a chair (including a wheelchair)?: A Lot Help needed standing up from a chair using your arms (e.g., wheelchair or bedside chair)?: A Lot Help needed to walk in hospital room?: Total Help needed climbing 3-5 steps with a railing? : Total 6 Click Score: 10    End of Session Equipment Utilized During Treatment: Gait belt Activity Tolerance: Patient tolerated treatment well Patient left: in bed;with call bell/phone within reach;with family/visitor present Nurse Communication: Mobility status PT Visit Diagnosis: Unsteadiness on feet (R26.81);Muscle weakness (generalized) (M62.81);Difficulty in walking, not elsewhere classified (R26.2)    Time: [id number] PT Time Calculation (min) (ACUTE ONLY): 37 min   Charges:   PT Evaluation $PT Eval Low Complexity: 1 Low PT Treatments $Gait Training: 8-22 mins $Therapeutic Activity: 8-22 mins        [place] Office [phone] Weekend [id number]   Claretha Cooper 05/24/2022, 10:44 AM

## 2022-05-24 NOTE — ED Provider Notes (Signed)
Emergency Medicine Observation Re-evaluation Note  [name] is a 89 y.o. female, seen on rounds today.  Pt initially presented to the ED for complaints of Back Pain Currently, the patient is asleep, appears comfrotable.  Physical Exam  BP (!) 141/60 (BP Location: Right Arm)   Pulse 65   Temp 98.5 F (36.9 C) (Oral)   Resp 16   Ht '5\' 3"'$  (1.6 m)   Wt 44.5 kg   SpO2 100%   BMI 17.36 kg/m  Physical Exam General: no distress Cardiac: regular rate Lungs: no resp distress Psych: calm  ED Course / MDM  EKG:   I have reviewed the labs performed to date as well as medications administered while in observation.  Recent changes in the last 24 hours include - no new changes. TOC seeing patient, but it seems like she was already at a SNF. Workup in the ER is reassuring. Has UTI.  Plan  Current plan is for [facility] recommendations.    Varney Biles, MD 05/24/22 [phone]

## 2022-05-24 NOTE — ED Notes (Signed)
Pt resting in room at this time, daughter at bedside

## 2022-05-24 NOTE — Progress Notes (Addendum)
This CSW attempted to contact pt's daughter, [name], without success. Left HIPAA Compliant voicemail.   Addend @ 1:01 PM This CSW spoke with [place] who informed If [facility] is unavailable, they wish to transition the pt back to [facility]. This CSW will continue to follow up with [facility] admissions rep.   Addend @ 1:04 [facility] has denied the pt. Rip Harbour, daughter, notified.   Addend @ 1:33 PM Spoke with [facility] who states they will get the pt set up with their in house PT and possibly [facility] if needed. Stark Jock reports only needing an FL2 and the AVS which she states the pt's daughter can provide once they are back at the facility. Rip Harbour, daughter, wishes to transport pt back to [facility] by [employer]. Faxes have been sent. EDP and RN notified via secure chat.

## 2022-05-24 NOTE — NC FL2 (Cosign Needed Addendum)
[place] LEVEL OF CARE FORM     IDENTIFICATION  Patient Name: [name] Birthdate: [birth year] Sex: female Admission Date (Current Location): 05/23/2022  [facility] and [place] Number:  Herbalist and Address:  [facility],  [place], [place]      Provider Number: [id number]  Attending Physician Name and Address:  Default, Provider, MD  Relative Name and Phone Number:  [name] (daughter) - [phone]    Current Level of Care: Hospital Recommended Level of Care: Assisted Living Prior Approval Number:    Date Approved/Denied:   PASRR Number: [id number]  Discharge Plan: SNF    Current Diagnoses: Patient Active Problem List   Diagnosis Date Noted   Age-related osteoporosis without current pathological fracture 07/23/2020   Anxiety 07/23/2020   Chronic kidney disease, stage 3 unspecified ([place]) 07/23/2020   Hypothyroid 07/23/2020   Old myocardial infarction 07/23/2020   Osteopenia 07/23/2020   Pure hypercholesterolemia 07/23/2020   Pain of left hip joint 11/13/2019   Menopausal syndrome 12/06/2015   Dyspnea on exertion 04/06/2015   Fluttering sensation of heart 04/06/2015   Chest tightness 04/06/2015   CAD (coronary artery disease) 09/09/2010   HTN (hypertension) 09/09/2010   Hyperlipidemia 09/09/2010    Orientation RESPIRATION BLADDER Height & Weight     Self, Place  Normal Incontinent Weight: 98 lb (44.5 kg) Height:  '5\' 3"'$  (160 cm)  BEHAVIORAL SYMPTOMS/MOOD NEUROLOGICAL BOWEL NUTRITION STATUS      Incontinent Diet (Regular)  AMBULATORY STATUS COMMUNICATION OF NEEDS Skin   Limited Assist Verbally Normal                       Personal Care Assistance Level of Assistance  Bathing, Feeding, Dressing Bathing Assistance: Limited assistance Feeding assistance: Independent Dressing Assistance: Limited assistance     Functional Limitations Info  Sight, Hearing, Speech Sight Info:  Adequate Hearing Info: Adequate Speech Info: Adequate    SPECIAL CARE FACTORS FREQUENCY  PT (By licensed PT), OT (By licensed OT)     PT Frequency: x5/week OT Frequency: x5/week            Contractures Contractures Info: Not present    Additional Factors Info  Code Status, Allergies, Psychotropic Code Status Info: Not on file Allergies Info: Actonel (Risedronate Sodium)  Altace (Ramipril) Psychotropic Info: None         Current Medications (05/24/2022):  This is the current hospital active medication list No current facility-administered medications for this encounter.   Current Outpatient Medications  Medication Sig Dispense Refill   acetaminophen (TYLENOL) 325 MG tablet Take 325 mg by mouth every 6 (six) hours as needed for moderate pain.     aspirin EC 81 MG tablet Take 81 mg by mouth daily. Swallow whole.     Calcium Carb-Cholecalciferol (CALCIUM CARBONATE-VITAMIN D3) 600-400 MG-UNIT TABS Take 1 tablet by mouth daily.     cephALEXin (KEFLEX) 500 MG capsule Take 1 capsule (500 mg total) by mouth 2 (two) times daily for 7 days. 14 capsule 0   Cholecalciferol (VITAMIN D3 SUPER STRENGTH) 50 MCG (2000 UT) TABS Take 1 tablet by mouth daily.     Cyanocobalamin (B-12) 2000 MCG TABS Take 2 tablets by mouth daily.     Docusate Sodium (DSS) 100 MG CAPS Take 1 capsule by mouth in the morning and at bedtime.     escitalopram (LEXAPRO) 5 MG tablet Take 5 mg by mouth at bedtime.     Ferrous  Sulfate (IRON) 325 (65 Fe) MG TABS Take 1 tablet by mouth 2 (two) times daily.     GOODSENSE CLEARLAX 17 GM/SCOOP powder Take 17 g by mouth daily.     isosorbide mononitrate (IMDUR) 30 MG 24 hr tablet TAKE 1/2 OF A TABLET (15 MG TOTAL) BY MOUTH DAILY 45 tablet 3   KLOR-CON M20 20 MEQ tablet TAKE 1 TABLET BY MOUTH EVERY DAY 90 tablet 3   lidocaine (LIDODERM) 5 % Place 1 patch onto the skin daily as needed (pain).     losartan-hydrochlorothiazide (HYZAAR) 100-12.5 MG tablet Take 1 tablet by mouth  daily. 90 tablet 1   metoprolol succinate (TOPROL-XL) 50 MG 24 hr tablet Take 1 tablet (50 mg total) by mouth daily. Please keep upcoming appt. In Dec. In order to receive future refills. 90 tablet 3   nitroGLYCERIN (NITROSTAT) 0.4 MG SL tablet Place 1 tablet (0.4 mg total) under the tongue every 5 (five) minutes as needed for chest pain. 25 tablet 8   simvastatin (ZOCOR) 20 MG tablet Take 1 tablet (20 mg total) by mouth at bedtime. (Patient taking differently: Take 20 mg by mouth in the morning.) 90 tablet 3   VOLTAREN 1 % GEL Apply 2 g topically in the morning, at noon, and at bedtime.       Discharge Medications: Please see discharge summary for a list of discharge medications.  Relevant Imaging Results:  Relevant Lab Results:   Additional Information SSN: [id number]  Kimber Relic, LCSW

## 2022-06-02 ENCOUNTER — Emergency Department (HOSPITAL_COMMUNITY)
Admission: EM | Admit: 2022-06-02 | Discharge: 2022-06-02 | Disposition: A | Discharge status: Home / Self Care | Payer: Medicare Other | Attending: Emergency Medicine | Admitting: Emergency Medicine

## 2022-06-02 ENCOUNTER — Other Ambulatory Visit: Payer: Self-pay

## 2022-06-02 ENCOUNTER — Emergency Department (HOSPITAL_COMMUNITY): Payer: Medicare Other

## 2022-06-02 ENCOUNTER — Encounter (HOSPITAL_COMMUNITY): Payer: Self-pay | Admitting: Emergency Medicine

## 2022-06-02 DIAGNOSIS — Z79899 Other long term (current) drug therapy: Secondary | ICD-10-CM | POA: Diagnosis not present

## 2022-06-02 DIAGNOSIS — Z7982 Long term (current) use of aspirin: Secondary | ICD-10-CM | POA: Diagnosis not present

## 2022-06-02 DIAGNOSIS — W19XXXA Unspecified fall, initial encounter: Secondary | ICD-10-CM

## 2022-06-02 DIAGNOSIS — R42 Dizziness and giddiness: Secondary | ICD-10-CM | POA: Diagnosis present

## 2022-06-02 DIAGNOSIS — F0390 Unspecified dementia without behavioral disturbance: Secondary | ICD-10-CM | POA: Insufficient documentation

## 2022-06-02 DIAGNOSIS — W01198A Fall on same level from slipping, tripping and stumbling with subsequent striking against other object, initial encounter: Secondary | ICD-10-CM | POA: Insufficient documentation

## 2022-06-02 DIAGNOSIS — I10 Essential (primary) hypertension: Secondary | ICD-10-CM | POA: Diagnosis not present

## 2022-06-02 LAB — CBC WITH DIFFERENTIAL/PLATELET
Abs Immature Granulocytes: 0.03 10*3/uL (ref 0.00–0.07)
Basophils Absolute: 0 10*3/uL (ref 0.0–0.1)
Basophils Relative: 0 %
Eosinophils Absolute: 0.1 10*3/uL (ref 0.0–0.5)
Eosinophils Relative: 1 %
HCT: 38.7 % (ref 36.0–46.0)
Hemoglobin: 12.3 g/dL (ref 12.0–15.0)
Immature Granulocytes: 0 %
Lymphocytes Relative: 13 %
Lymphs Abs: 0.9 10*3/uL (ref 0.7–4.0)
MCH: 30.8 pg (ref 26.0–34.0)
MCHC: 31.8 g/dL (ref 30.0–36.0)
MCV: 97 fL (ref 80.0–100.0)
Monocytes Absolute: 0.8 10*3/uL (ref 0.1–1.0)
Monocytes Relative: 12 %
Neutro Abs: 4.9 10*3/uL (ref 1.7–7.7)
Neutrophils Relative %: 74 %
Platelets: 324 10*3/uL (ref 150–400)
RBC: 3.99 MIL/uL (ref 3.87–5.11)
RDW: 14.1 % (ref 11.5–15.5)
WBC: 6.7 10*3/uL (ref 4.0–10.5)
nRBC: 0 % (ref 0.0–0.2)

## 2022-06-02 LAB — BASIC METABOLIC PANEL
Anion gap: 6 (ref 5–15)
BUN: 22 mg/dL (ref 8–23)
CO2: 22 mmol/L (ref 22–32)
Calcium: 8.3 mg/dL — ABNORMAL LOW (ref 8.9–10.3)
Chloride: 106 mmol/L (ref 98–111)
Creatinine, Ser: 0.94 mg/dL (ref 0.44–1.00)
GFR, Estimated: 59 mL/min — ABNORMAL LOW (ref >60)
Glucose, Bld: 105 mg/dL — ABNORMAL HIGH (ref 70–99)
Potassium: 3.6 mmol/L (ref 3.5–5.1)
Sodium: 134 mmol/L — ABNORMAL LOW (ref 135–145)

## 2022-06-02 MED ORDER — MECLIZINE HCL 25 MG PO TABS
12.5000 mg | ORAL_TABLET | Freq: Once | ORAL | Status: AC
  Administered 2022-06-02: 12.5 mg via ORAL
  Filled 2022-06-02: qty 1

## 2022-06-02 NOTE — ED Provider Notes (Signed)
[facility] AT [facility] Provider Note   CSN: [id number] Arrival date & time: 06/02/22  1845     History  Chief Complaint  Patient presents with   Noreene Filbert Latoyia Oris is a 89 y.o. female history of dementia, hypertension, here presenting with fall.  Patient states that she was walking and states that she became dizzy and fell and hit her head.  Patient denies any other injuries.  Patient denies passing out.  The history is provided by the patient.  Fall       Home Medications Prior to Admission medications   Medication Sig Start Date End Date Taking? Authorizing Provider  acetaminophen (TYLENOL) 325 MG tablet Take 325 mg by mouth every 6 (six) hours as needed for moderate pain.    [provider]  aspirin EC 81 MG tablet Take 81 mg by mouth daily. Swallow whole.    [provider]  Calcium Carb-Cholecalciferol (CALCIUM CARBONATE-VITAMIN D3) 600-400 MG-UNIT TABS Take 1 tablet by mouth daily.    [provider]  Cholecalciferol (VITAMIN D3 SUPER STRENGTH) 50 MCG (2000 UT) TABS Take 1 tablet by mouth daily.    [provider]  Cyanocobalamin (B-12) 2000 MCG TABS Take 2 tablets by mouth daily. 04/10/22   [provider]  Docusate Sodium (DSS) 100 MG CAPS Take 1 capsule by mouth in the morning and at bedtime.    [provider]  escitalopram (LEXAPRO) 5 MG tablet Take 5 mg by mouth at bedtime. 02/02/20   [provider]  Ferrous Sulfate (IRON) 325 (65 Fe) MG TABS Take 1 tablet by mouth 2 (two) times daily. 04/10/22   [provider]  Jamie Kato 17 GM/SCOOP powder Take 17 g by mouth daily. 05/16/22   [provider]  isosorbide mononitrate (IMDUR) 30 MG 24 hr tablet TAKE 1/2 OF A TABLET (15 MG TOTAL) BY MOUTH DAILY 06/15/21   Burnell Blanks, MD  KLOR-CON M20 20 MEQ tablet TAKE 1 TABLET BY MOUTH EVERY DAY 06/10/21   Burnell Blanks, MD   lidocaine (LIDODERM) 5 % Place 1 patch onto the skin daily as needed (pain).    [provider]  losartan-hydrochlorothiazide (HYZAAR) 100-12.5 MG tablet Take 1 tablet by mouth daily. 08/29/21   Burnell Blanks, MD  metoprolol succinate (TOPROL-XL) 50 MG 24 hr tablet Take 1 tablet (50 mg total) by mouth daily. Please keep upcoming appt. In Dec. In order to receive future refills. 01/03/21   Burnell Blanks, MD  nitroGLYCERIN (NITROSTAT) 0.4 MG SL tablet Place 1 tablet (0.4 mg total) under the tongue every 5 (five) minutes as needed for chest pain. 06/13/21   Burnell Blanks, MD  simvastatin (ZOCOR) 20 MG tablet Take 1 tablet (20 mg total) by mouth at bedtime. Patient taking differently: Take 20 mg by mouth in the morning. 07/12/21   Burnell Blanks, MD  VOLTAREN 1 % GEL Apply 2 g topically in the morning, at noon, and at bedtime.    [provider]      Allergies    Actonel [risedronate sodium] and Altace [ramipril]    Review of Systems   Review of Systems  Psychiatric/Behavioral:  Positive for confusion.   All other systems reviewed and are negative.   Physical Exam Updated Vital Signs There were no vitals taken for this visit. Physical Exam Vitals and nursing note reviewed.  Constitutional:      Comments: Demented   HENT:  Head: Normocephalic.     Comments: Questionable posterior scalp hematoma    Nose: Nose normal.     Mouth/Throat:     Mouth: Mucous membranes are moist.  Eyes:     Extraocular Movements: Extraocular movements intact.     Pupils: Pupils are equal, round, and reactive to light.  Cardiovascular:     Rate and Rhythm: Normal rate and regular rhythm.     Pulses: Normal pulses.     Heart sounds: Normal heart sounds.  Pulmonary:     Effort: Pulmonary effort is normal.     Breath sounds: Normal breath sounds.  Abdominal:     General: Abdomen is flat.     Palpations: Abdomen is soft.  Musculoskeletal:         General: Normal range of motion.     Cervical back: Normal range of motion and neck supple.     Comments: No spinal tenderness.  No signs of extremity trauma  Skin:    General: Skin is warm.     Capillary Refill: Capillary refill takes less than 2 seconds.  Neurological:     Comments: Demented.  Patient is moving all extremities  Psychiatric:        Mood and Affect: Mood normal.     ED Results / Procedures / Treatments   Labs (all labs ordered are listed, but only abnormal results are displayed) Labs Reviewed  CBC WITH DIFFERENTIAL/PLATELET  BASIC METABOLIC PANEL    EKG None  Radiology No results found.  Procedures Procedures    Medications Ordered in ED Medications  meclizine (ANTIVERT) tablet 12.5 mg (has no administration in time range)    ED Course/ Medical Decision Making/ A&P                             Medical Decision Making Maureen Katzen Panarello is a 89 y.o. female here presenting with unwitnessed fall.  Patient states that she was dizzy and then fell and hit her head.  Patient has nonfocal neuroexam right now.  Will check electrolytes and CT head.  I have low suspicion for posterior circulation stroke.  She just had an MRI recently that was unremarkable for similar symptoms.  I do not think she needs an MRI of her CT is negative  10:28 PM I reviewed patient's labs and independently interpreted imaging studies.  Labs are unremarkable.  CT head showed no bleeding.  There is some incidental gas in between the eyelids but no fractures.  At this point, patient is stable for discharge  Problems Addressed: Fall, initial encounter: acute illness or injury  Amount and/or Complexity of Data Reviewed Labs: ordered. Decision-making details documented in ED Course. Radiology: ordered and independent interpretation performed. Decision-making details documented in ED Course. ECG/medicine tests: ordered.    Final Clinical Impression(s) / ED Diagnoses Final  diagnoses:  None    Rx / DC Orders ED Discharge Orders     None         Drenda Freeze, MD 06/02/22 2229

## 2022-06-02 NOTE — ED Triage Notes (Signed)
While at [facility], the patient had an unwitnessed trip and fall. Patient may have hit her head om the floor. She has not complaints of injury or pain.   EMS vitals: 136/70 BP 72 HR 16 RR

## 2022-06-02 NOTE — Discharge Instructions (Signed)
Your CT scan was unremarkable today  Your labs were unremarkable  Fall precautions at the facility  See your doctor for follow-up  Return to ER if you have another fall, lethargy

## 2022-06-05 ENCOUNTER — Encounter: Payer: Self-pay | Admitting: Physician Assistant

## 2022-06-12 ENCOUNTER — Other Ambulatory Visit: Payer: Self-pay | Admitting: Internal Medicine

## 2022-06-12 ENCOUNTER — Ambulatory Visit
Admission: RE | Admit: 2022-06-12 | Discharge: 2022-06-12 | Disposition: A | Discharge status: Home / Self Care | Payer: Medicare Other | Source: Ambulatory Visit | Attending: Internal Medicine | Admitting: Internal Medicine

## 2022-06-12 DIAGNOSIS — M25551 Pain in right hip: Secondary | ICD-10-CM

## 2022-06-14 ENCOUNTER — Emergency Department (HOSPITAL_COMMUNITY): Payer: Medicare Other

## 2022-06-14 ENCOUNTER — Emergency Department (HOSPITAL_COMMUNITY)
Admission: EM | Admit: 2022-06-14 | Discharge: 2022-06-14 | Disposition: A | Discharge status: Home / Self Care | Payer: Medicare Other | Attending: Emergency Medicine | Admitting: Emergency Medicine

## 2022-06-14 ENCOUNTER — Other Ambulatory Visit: Payer: Self-pay

## 2022-06-14 ENCOUNTER — Encounter (HOSPITAL_COMMUNITY): Payer: Self-pay

## 2022-06-14 DIAGNOSIS — S3992XA Unspecified injury of lower back, initial encounter: Secondary | ICD-10-CM | POA: Diagnosis present

## 2022-06-14 DIAGNOSIS — S300XXA Contusion of lower back and pelvis, initial encounter: Secondary | ICD-10-CM | POA: Insufficient documentation

## 2022-06-14 DIAGNOSIS — I2510 Atherosclerotic heart disease of native coronary artery without angina pectoris: Secondary | ICD-10-CM | POA: Diagnosis not present

## 2022-06-14 DIAGNOSIS — D72829 Elevated white blood cell count, unspecified: Secondary | ICD-10-CM | POA: Insufficient documentation

## 2022-06-14 DIAGNOSIS — Z79899 Other long term (current) drug therapy: Secondary | ICD-10-CM | POA: Insufficient documentation

## 2022-06-14 DIAGNOSIS — R748 Abnormal levels of other serum enzymes: Secondary | ICD-10-CM | POA: Diagnosis not present

## 2022-06-14 DIAGNOSIS — F0390 Unspecified dementia without behavioral disturbance: Secondary | ICD-10-CM | POA: Insufficient documentation

## 2022-06-14 DIAGNOSIS — Z7982 Long term (current) use of aspirin: Secondary | ICD-10-CM | POA: Diagnosis not present

## 2022-06-14 DIAGNOSIS — Y9289 Other specified places as the place of occurrence of the external cause: Secondary | ICD-10-CM | POA: Diagnosis not present

## 2022-06-14 DIAGNOSIS — W19XXXA Unspecified fall, initial encounter: Secondary | ICD-10-CM

## 2022-06-14 DIAGNOSIS — I10 Essential (primary) hypertension: Secondary | ICD-10-CM | POA: Diagnosis not present

## 2022-06-14 DIAGNOSIS — W1839XA Other fall on same level, initial encounter: Secondary | ICD-10-CM | POA: Diagnosis not present

## 2022-06-14 LAB — CBC WITH DIFFERENTIAL/PLATELET
Abs Immature Granulocytes: 0.04 10*3/uL (ref 0.00–0.07)
Basophils Absolute: 0 10*3/uL (ref 0.0–0.1)
Basophils Relative: 0 %
Eosinophils Absolute: 0 10*3/uL (ref 0.0–0.5)
Eosinophils Relative: 0 %
HCT: 36.1 % (ref 36.0–46.0)
Hemoglobin: 11.8 g/dL — ABNORMAL LOW (ref 12.0–15.0)
Immature Granulocytes: 0 %
Lymphocytes Relative: 10 %
Lymphs Abs: 1.2 10*3/uL (ref 0.7–4.0)
MCH: 30.8 pg (ref 26.0–34.0)
MCHC: 32.7 g/dL (ref 30.0–36.0)
MCV: 94.3 fL (ref 80.0–100.0)
Monocytes Absolute: 1.4 10*3/uL — ABNORMAL HIGH (ref 0.1–1.0)
Monocytes Relative: 12 %
Neutro Abs: 8.9 10*3/uL — ABNORMAL HIGH (ref 1.7–7.7)
Neutrophils Relative %: 78 %
Platelets: 351 10*3/uL (ref 150–400)
RBC: 3.83 MIL/uL — ABNORMAL LOW (ref 3.87–5.11)
RDW: 13.5 % (ref 11.5–15.5)
WBC: 11.5 10*3/uL — ABNORMAL HIGH (ref 4.0–10.5)
nRBC: 0 % (ref 0.0–0.2)

## 2022-06-14 LAB — COMPREHENSIVE METABOLIC PANEL
ALT: 21 U/L (ref 0–44)
AST: 30 U/L (ref 15–41)
Albumin: 3.4 g/dL — ABNORMAL LOW (ref 3.5–5.0)
Alkaline Phosphatase: 91 U/L (ref 38–126)
Anion gap: 12 (ref 5–15)
BUN: 25 mg/dL — ABNORMAL HIGH (ref 8–23)
CO2: 22 mmol/L (ref 22–32)
Calcium: 9.3 mg/dL (ref 8.9–10.3)
Chloride: 101 mmol/L (ref 98–111)
Creatinine, Ser: 0.97 mg/dL (ref 0.44–1.00)
GFR, Estimated: 57 mL/min — ABNORMAL LOW (ref >60)
Glucose, Bld: 106 mg/dL — ABNORMAL HIGH (ref 70–99)
Potassium: 3.6 mmol/L (ref 3.5–5.1)
Sodium: 135 mmol/L (ref 135–145)
Total Bilirubin: 0.5 mg/dL (ref 0.3–1.2)
Total Protein: 7.3 g/dL (ref 6.5–8.1)

## 2022-06-14 LAB — URINALYSIS, ROUTINE W REFLEX MICROSCOPIC
Bilirubin Urine: NEGATIVE
Glucose, UA: NEGATIVE mg/dL
Hgb urine dipstick: NEGATIVE
Ketones, ur: 5 mg/dL — AB
Nitrite: NEGATIVE
Protein, ur: NEGATIVE mg/dL
Specific Gravity, Urine: 1.011 (ref 1.005–1.030)
pH: 5 (ref 5.0–8.0)

## 2022-06-14 LAB — CK: Total CK: 352 U/L — ABNORMAL HIGH (ref 38–234)

## 2022-06-14 MED ORDER — ACETAMINOPHEN 325 MG PO TABS
650.0000 mg | ORAL_TABLET | Freq: Once | ORAL | Status: AC
  Administered 2022-06-14: 650 mg via ORAL
  Filled 2022-06-14: qty 2

## 2022-06-14 NOTE — ED Notes (Signed)
Was able to stand with walker, only complains of sacrum hurting and no pain to left hip.

## 2022-06-14 NOTE — ED Provider Notes (Signed)
[name]   CSN: [id number] Arrival date & time: 06/14/22  [id number]     History  Chief Complaint  Patient presents with   [name] is a 89 y.o. female.  Pt is a 89 yo female with pmhx significant for dementia, cad, hld, htn, and anemia.  Pt had a fall this am at [facility].  It was an unwitnessed fall.  It is unclear how long she was on the ground.  Pt c/o low back pain.  Pt is unable to describe her fall.       Home Medications Prior to Admission medications   Medication Sig Start Date End Date Taking? Authorizing Provider  acetaminophen (TYLENOL) 325 MG tablet Take 325 mg by mouth every 6 (six) hours as needed for moderate pain.    [provider]  aspirin EC 81 MG tablet Take 81 mg by mouth daily. Swallow whole.    [provider]  Calcium Carb-Cholecalciferol (CALCIUM CARBONATE-VITAMIN D3) 600-400 MG-UNIT TABS Take 1 tablet by mouth daily.    [provider]  Cholecalciferol (VITAMIN D3 SUPER STRENGTH) 50 MCG (2000 UT) TABS Take 1 tablet by mouth daily.    [provider]  Cyanocobalamin (B-12) 2000 MCG TABS Take 2 tablets by mouth daily. 04/10/22   [provider]  Docusate Sodium (DSS) 100 MG CAPS Take 1 capsule by mouth in the morning and at bedtime.    [provider]  escitalopram (LEXAPRO) 5 MG tablet Take 5 mg by mouth at bedtime. 02/02/20   [provider]  Ferrous Sulfate (IRON) 325 (65 Fe) MG TABS Take 1 tablet by mouth 2 (two) times daily. 04/10/22   [provider]  Jamie Kato 17 GM/SCOOP powder Take 17 g by mouth daily. 05/16/22   [provider]  isosorbide mononitrate (IMDUR) 30 MG 24 hr tablet TAKE 1/2 OF A TABLET (15 MG TOTAL) BY MOUTH DAILY 06/15/21   Burnell Blanks, MD  KLOR-CON M20 20 MEQ tablet TAKE 1 TABLET BY MOUTH EVERY DAY 06/10/21   Burnell Blanks, MD  lidocaine  (LIDODERM) 5 % Place 1 patch onto the skin daily as needed (pain).    [provider]  losartan-hydrochlorothiazide (HYZAAR) 100-12.5 MG tablet Take 1 tablet by mouth daily. 08/29/21   Burnell Blanks, MD  metoprolol succinate (TOPROL-XL) 50 MG 24 hr tablet Take 1 tablet (50 mg total) by mouth daily. Please keep upcoming appt. In Dec. In order to receive future refills. 01/03/21   Burnell Blanks, MD  nitroGLYCERIN (NITROSTAT) 0.4 MG SL tablet Place 1 tablet (0.4 mg total) under the tongue every 5 (five) minutes as needed for chest pain. 06/13/21   Burnell Blanks, MD  simvastatin (ZOCOR) 20 MG tablet Take 1 tablet (20 mg total) by mouth at bedtime. Patient taking differently: Take 20 mg by mouth in the morning. 07/12/21   Burnell Blanks, MD  VOLTAREN 1 % GEL Apply 2 g topically in the morning, at noon, and at bedtime.    [provider]      Allergies    Actonel [risedronate sodium] and Altace [ramipril]    Review of Systems   Review of Systems  Musculoskeletal:        Back pain  All other systems reviewed and are negative.   Physical Exam Updated Vital Signs BP (!) 116/43   Pulse (!) 57   Temp (!) 97.4  F (36.3 C)   Resp 18   Ht 5\' 3"  (1.6 m)   Wt 44.5 kg   SpO2 98%   BMI 17.36 kg/m  Physical Exam Vitals and nursing Pearson reviewed.  Constitutional:      Appearance: Normal appearance.  HENT:     Head: Normocephalic and atraumatic.     Right Ear: External ear normal.     Left Ear: External ear normal.     Nose: Nose normal.     Mouth/Throat:     Mouth: Mucous membranes are moist.     Pharynx: Oropharynx is clear.  Eyes:     Extraocular Movements: Extraocular movements intact.     Conjunctiva/sclera: Conjunctivae normal.     Pupils: Pupils are equal, round, and reactive to light.  Neck:     Comments: In c-collar Cardiovascular:     Rate and Rhythm: Normal rate and regular rhythm.     Pulses: Normal pulses.     Heart  sounds: Normal heart sounds.  Pulmonary:     Effort: Pulmonary effort is normal.     Breath sounds: Normal breath sounds.  Abdominal:     General: Abdomen is flat. Bowel sounds are normal.     Palpations: Abdomen is soft.  Musculoskeletal:       Arms:  Skin:    General: Skin is warm.     Capillary Refill: Capillary refill takes less than 2 seconds.  Neurological:     General: No focal deficit present.     Mental Status: She is alert and oriented to person, place, and time.  Psychiatric:        Mood and Affect: Mood normal.        Behavior: Behavior normal.     ED Results / Procedures / Treatments   Labs (all labs ordered are listed, but only abnormal results are displayed) Labs Reviewed  COMPREHENSIVE METABOLIC PANEL - Abnormal; Notable for the following components:      Result Value   Glucose, Bld 106 (*)    BUN 25 (*)    Albumin 3.4 (*)    GFR, Estimated 57 (*)    All other components within normal limits  CBC WITH DIFFERENTIAL/PLATELET - Abnormal; Notable for the following components:   WBC 11.5 (*)    RBC 3.83 (*)    Hemoglobin 11.8 (*)    Neutro Abs 8.9 (*)    Monocytes Absolute 1.4 (*)    All other components within normal limits  URINALYSIS, ROUTINE W REFLEX MICROSCOPIC - Abnormal; Notable for the following components:   Ketones, ur 5 (*)    Leukocytes,Ua MODERATE (*)    Bacteria, UA RARE (*)    All other components within normal limits  CK - Abnormal; Notable for the following components:   Total CK 352 (*)    All other components within normal limits    EKG None  Radiology CT Head Wo Contrast  Result Date: 06/14/2022 CLINICAL DATA:  Fall EXAM: CT HEAD WITHOUT CONTRAST CT CERVICAL SPINE WITHOUT CONTRAST TECHNIQUE: Multidetector CT imaging of the head and cervical spine was performed following the standard protocol without intravenous contrast. Multiplanar CT image reconstructions of the cervical spine were also generated. RADIATION DOSE REDUCTION: This  exam was performed according to the departmental dose-optimization program which includes automated exposure control, adjustment of the mA and/or kV according to patient size and/or use of iterative reconstruction technique. COMPARISON:  CT head and cervical spine 06/02/2022 FINDINGS: CT HEAD FINDINGS Brain: There is  no acute intracranial hemorrhage, extra-axial fluid collection, or acute infarct. Parenchymal volume is within expected limits for age. The ventricles are stable in size. Patchy hypodensity in the supratentorial white matter consistent with underlying chronic small-vessel ischemic change is stable. The pituitary and suprasellar region are normal. There is no mass lesion. There is no mass effect or midline shift. Vascular: There is calcification of the bilateral carotid siphons and vertebral arteries. Skull: Normal. Negative for fracture or focal lesion. Sinuses/Orbits: The paranasal sinuses are clear. Bilateral lens implants are in place. The globes and orbits are otherwise unremarkable. Other: None. CT CERVICAL SPINE FINDINGS Alignment: Grade 1 anterolisthesis of C2 on C3, retrolisthesis of C4 on C5, and anterolisthesis of C7 on T1 and T1 on T2 are unchanged, all likely degenerative in nature. There is no jumped or perched facet or other evidence of traumatic malalignment. Skull base and vertebrae: Skull base alignment is maintained. Vertebral body heights are preserved. There is no evidence of acute fracture. There is no suspicious osseous lesion. Soft tissues and spinal canal: No prevertebral fluid or swelling. No visible canal hematoma. Disc levels: Advanced multilevel degenerative change of the cervical spine is overall similar to the prior study. There is no evidence of high-grade spinal canal stenosis. Upper chest: Mild biapical pleural scarring is unchanged. Other: None. IMPRESSION: 1. Stable noncontrast head CT with no acute intracranial pathology. 2. No acute fracture or traumatic malalignment  of the cervical spine. Electronically Signed   By: Valetta Mole M.D.   On: 06/14/2022 10:56   CT Cervical Spine Wo Contrast  Result Date: 06/14/2022 CLINICAL DATA:  Fall EXAM: CT HEAD WITHOUT CONTRAST CT CERVICAL SPINE WITHOUT CONTRAST TECHNIQUE: Multidetector CT imaging of the head and cervical spine was performed following the standard protocol without intravenous contrast. Multiplanar CT image reconstructions of the cervical spine were also generated. RADIATION DOSE REDUCTION: This exam was performed according to the departmental dose-optimization program which includes automated exposure control, adjustment of the mA and/or kV according to patient size and/or use of iterative reconstruction technique. COMPARISON:  CT head and cervical spine 06/02/2022 FINDINGS: CT HEAD FINDINGS Brain: There is no acute intracranial hemorrhage, extra-axial fluid collection, or acute infarct. Parenchymal volume is within expected limits for age. The ventricles are stable in size. Patchy hypodensity in the supratentorial white matter consistent with underlying chronic small-vessel ischemic change is stable. The pituitary and suprasellar region are normal. There is no mass lesion. There is no mass effect or midline shift. Vascular: There is calcification of the bilateral carotid siphons and vertebral arteries. Skull: Normal. Negative for fracture or focal lesion. Sinuses/Orbits: The paranasal sinuses are clear. Bilateral lens implants are in place. The globes and orbits are otherwise unremarkable. Other: None. CT CERVICAL SPINE FINDINGS Alignment: Grade 1 anterolisthesis of C2 on C3, retrolisthesis of C4 on C5, and anterolisthesis of C7 on T1 and T1 on T2 are unchanged, all likely degenerative in nature. There is no jumped or perched facet or other evidence of traumatic malalignment. Skull base and vertebrae: Skull base alignment is maintained. Vertebral body heights are preserved. There is no evidence of acute fracture. There  is no suspicious osseous lesion. Soft tissues and spinal canal: No prevertebral fluid or swelling. No visible canal hematoma. Disc levels: Advanced multilevel degenerative change of the cervical spine is overall similar to the prior study. There is no evidence of high-grade spinal canal stenosis. Upper chest: Mild biapical pleural scarring is unchanged. Other: None. IMPRESSION: 1. Stable noncontrast head CT with no  acute intracranial pathology. 2. No acute fracture or traumatic malalignment of the cervical spine. Electronically Signed   By: Valetta Mole M.D.   On: 06/14/2022 10:56   DG HIP UNILAT WITH PELVIS 2-3 VIEWS RIGHT  Result Date: 06/14/2022 CLINICAL DATA:  Fall 1.5 weeks ago with right hip pain and limited weight-bearing. EXAM: DG HIP (WITH OR WITHOUT PELVIS) 2-3V RIGHT COMPARISON:  Pelvic CT 05/23/2022. FINDINGS: AP and frog-leg lateral views of the right hip dated 06/12/2022. The bones appear mildly demineralized. There is no evidence of acute fracture, dislocation or femoral head osteonecrosis. Range of motion at the right hip appears adequate, and there is no significant hip arthropathy. Unchanged chronic deformity of the symphysis pubis with associated osteitis pubis. IMPRESSION: No evidence of acute right hip fracture or dislocation. Electronically Signed   By: Richardean Sale M.D.   On: 06/14/2022 10:20   DG Pelvis 1-2 Views  Result Date: 06/14/2022 CLINICAL DATA:  Low back and left hip pain after falling approximately 2 weeks ago. EXAM: SACRUM AND COCCYX - 2+ VIEW; PELVIS - 1-2 VIEW COMPARISON:  Right hip radiographs 06/12/2022. Pelvic CT 05/23/2022. FINDINGS: The bones appear mildly demineralized. There is no evidence of acute fracture or dislocation. The sacroiliac joints are intact. No significant hip arthropathy. Chronic osteitis pubis appears similar to recent CT. Lumbar spine findings are dictated separately. There is a grade 1 anterolisthesis at L5-S1 with associated lower lumbar  spondylosis and facet hypertrophy. IMPRESSION: No evidence of acute fracture or dislocation in the pelvis or hips. Lower lumbar spondylosis. Electronically Signed   By: Richardean Sale M.D.   On: 06/14/2022 10:17   DG Sacrum/Coccyx  Result Date: 06/14/2022 CLINICAL DATA:  Low back and left hip pain after falling approximately 2 weeks ago. EXAM: SACRUM AND COCCYX - 2+ VIEW; PELVIS - 1-2 VIEW COMPARISON:  Right hip radiographs 06/12/2022. Pelvic CT 05/23/2022. FINDINGS: The bones appear mildly demineralized. There is no evidence of acute fracture or dislocation. The sacroiliac joints are intact. No significant hip arthropathy. Chronic osteitis pubis appears similar to recent CT. Lumbar spine findings are dictated separately. There is a grade 1 anterolisthesis at L5-S1 with associated lower lumbar spondylosis and facet hypertrophy. IMPRESSION: No evidence of acute fracture or dislocation in the pelvis or hips. Lower lumbar spondylosis. Electronically Signed   By: Richardean Sale M.D.   On: 06/14/2022 10:17   DG Lumbar Spine Complete  Result Date: 06/14/2022 CLINICAL DATA:  Fall. EXAM: LUMBAR SPINE - COMPLETE 4+ VIEW COMPARISON:  CT abdomen/pelvis 05/23/2022. FINDINGS: Five views. Unchanged grade 1 anterolisthesis of L4 on L5 and L5 on S1. Normal vertebral body heights. No displaced fracture. Unchanged multilevel degenerative disc height loss. IMPRESSION: No displaced fracture or traumatic listhesis of the lumbar spine. Multilevel lumbar spondylosis with unchanged grade 1 anterolisthesis of L4 on L5 and L5 on S1. Electronically Signed   By: Emmit Alexanders M.D.   On: 06/14/2022 10:12   DG Chest 1 View  Result Date: 06/14/2022 CLINICAL DATA:  Fall EXAM: CHEST  1 VIEW COMPARISON:  Chest x-ray 04/02/2015. FINDINGS: Cardiomediastinal silhouette is within normal limits for technique (supine rotated positioning). No consolidation. No visible pleural effusions or pneumothorax. No acute fracture. IMPRESSION: No  active disease. Electronically Signed   By: Margaretha Sheffield M.D.   On: 06/14/2022 10:11    Procedures Procedures    Medications Ordered in ED Medications  acetaminophen (TYLENOL) tablet 650 mg (650 mg Oral Given 06/14/22 [id number])    ED Course/ Medical Decision  Making/ A&P                             Medical Decision Making Amount and/or Complexity of Data Reviewed Labs: ordered. Radiology: ordered.  Risk OTC drugs.   This patient presents to the ED for concern of fall, this involves an extensive number of treatment options, and is a complaint that carries with it a high risk of complications and morbidity.  The differential diagnosis includes multiple trauma   Co morbidities that complicate the patient evaluation  dementia, cad, hld, htn, and anemia   Additional history obtained:  Additional history obtained from epic chart review External records from outside source obtained and reviewed including EMS report   Lab Tests:  I Ordered, and personally interpreted labs.  The pertinent results include:  cbc with wbc 11.5, cmp nl, ua with 5+ ketones, rare bacteria, no hemoglobin; ck sl elevated at 352   Imaging Studies ordered:  I ordered imaging studies including ct head/c-spine; cxr, pelvis xr, lumbar, coccyx  I independently visualized and interpreted imaging which showed  CT head/c-spine:  Stable noncontrast head CT with no acute intracranial pathology.  2. No acute fracture or traumatic malalignment of the cervical  spine.      Pelvis: No evidence of acute fracture or dislocation in the pelvis or hips.  Lower lumbar spondylosis.    Sacrum: No evidence of acute fracture or dislocation in the pelvis or hips.  Lower lumbar spondylosis.  Lumbar: No displaced fracture or traumatic listhesis of the lumbar spine.  Multilevel lumbar spondylosis with unchanged grade 1 anterolisthesis  of L4 on L5 and L5 on S1.  CXR: No active disease.  I agree with the  radiologist interpretation   Cardiac Monitoring:  The patient was maintained on a cardiac monitor.  I personally viewed and interpreted the cardiac monitored which showed an underlying rhythm of: nsr   Medicines ordered and prescription drug management:  I ordered medication including tylenol  for pain  Reevaluation of the patient after these medicines showed that the patient improved I have reviewed the patients home medicines and have made adjustments as needed   Test Considered:  ct  Problem List / ED Course:  Fall:  no fx.  Pt is able to stand and walk with walker.  Pt will be going home with her daughter.  Pt is stable for d/c.  Return if worse.    Reevaluation:  After the interventions noted above, I reevaluated the patient and found that they have :improved   Social Determinants of Health:  From facility, but is going home with daughter   Dispostion:  After consideration of the diagnostic results and the patients response to treatment, I feel that the patent would benefit from discharge with outpatient f/u.          Final Clinical Impression(s) / ED Diagnoses Final diagnoses:  Fall, initial encounter  Contusion of coccyx, initial encounter    Rx / DC Orders ED Discharge Orders     None         Isla Pence, MD 06/14/22 1150

## 2022-06-14 NOTE — Discharge Instructions (Signed)
Tylenol as needed for pain.

## 2022-06-14 NOTE — ED Triage Notes (Signed)
Coming from [facility] after an unwitnessed fall.  Patient complains of lower back pain and left hip pain.  Hx of Dementia. Oriented x 2 +pedal pulse.  Patient will not straighten left leg for me.  Pupils equal and reactive.

## 2022-07-10 ENCOUNTER — Ambulatory Visit: Payer: Medicare Other | Admitting: Physician Assistant

## 2022-07-24 ENCOUNTER — Ambulatory Visit: Payer: Medicare Other | Admitting: Neurology

## 2022-09-18 DEATH — deceased
